# Patient Record
Sex: Female | Born: 1950 | Race: Black or African American | Hispanic: No | Marital: Married | Smoking: Never smoker
Health system: Southern US, Community
[De-identification: ages and names within clinical notes are randomized; demographics above are authoritative.]

## PROBLEM LIST (undated history)

## (undated) DIAGNOSIS — D509 Iron deficiency anemia, unspecified: Secondary | ICD-10-CM

## (undated) DIAGNOSIS — D649 Anemia, unspecified: Secondary | ICD-10-CM

## (undated) DIAGNOSIS — J309 Allergic rhinitis, unspecified: Secondary | ICD-10-CM

## (undated) DIAGNOSIS — Z973 Presence of spectacles and contact lenses: Secondary | ICD-10-CM

## (undated) DIAGNOSIS — N950 Postmenopausal bleeding: Secondary | ICD-10-CM

## (undated) DIAGNOSIS — I10 Essential (primary) hypertension: Secondary | ICD-10-CM

## (undated) DIAGNOSIS — M51369 Other intervertebral disc degeneration, lumbar region without mention of lumbar back pain or lower extremity pain: Secondary | ICD-10-CM

## (undated) DIAGNOSIS — M1990 Unspecified osteoarthritis, unspecified site: Secondary | ICD-10-CM

## (undated) DIAGNOSIS — K219 Gastro-esophageal reflux disease without esophagitis: Secondary | ICD-10-CM

## (undated) DIAGNOSIS — M5432 Sciatica, left side: Secondary | ICD-10-CM

## (undated) DIAGNOSIS — R9389 Abnormal findings on diagnostic imaging of other specified body structures: Secondary | ICD-10-CM

## (undated) DIAGNOSIS — M5136 Other intervertebral disc degeneration, lumbar region: Secondary | ICD-10-CM

## (undated) HISTORY — PX: TUBAL LIGATION: SHX77

---

## 1999-04-28 ENCOUNTER — Encounter: Payer: Self-pay | Admitting: Emergency Medicine

## 1999-04-28 ENCOUNTER — Emergency Department (HOSPITAL_COMMUNITY): Admission: EM | Admit: 1999-04-28 | Discharge: 1999-04-28 | Payer: Self-pay | Admitting: Emergency Medicine

## 2002-08-04 ENCOUNTER — Emergency Department (HOSPITAL_COMMUNITY): Admission: EM | Admit: 2002-08-04 | Discharge: 2002-08-05 | Payer: Self-pay | Admitting: Emergency Medicine

## 2002-08-04 ENCOUNTER — Encounter: Payer: Self-pay | Admitting: Emergency Medicine

## 2003-10-16 ENCOUNTER — Encounter: Admission: RE | Admit: 2003-10-16 | Discharge: 2003-10-16 | Payer: Self-pay | Admitting: Family Medicine

## 2003-10-21 ENCOUNTER — Ambulatory Visit (HOSPITAL_COMMUNITY): Admission: RE | Admit: 2003-10-21 | Discharge: 2003-10-21 | Payer: Self-pay | Admitting: Family Medicine

## 2006-01-23 ENCOUNTER — Emergency Department (HOSPITAL_COMMUNITY): Admission: EM | Admit: 2006-01-23 | Discharge: 2006-01-23 | Payer: Self-pay | Admitting: Family Medicine

## 2006-04-24 ENCOUNTER — Emergency Department (HOSPITAL_COMMUNITY): Admission: EM | Admit: 2006-04-24 | Discharge: 2006-04-24 | Payer: Self-pay | Admitting: Emergency Medicine

## 2012-03-06 ENCOUNTER — Ambulatory Visit (INDEPENDENT_AMBULATORY_CARE_PROVIDER_SITE_OTHER): Payer: BC Managed Care – PPO | Admitting: Family Medicine

## 2012-03-06 VITALS — BP 125/82 | HR 61 | Temp 98.6°F | Resp 18 | Ht 64.0 in | Wt 222.0 lb

## 2012-03-06 DIAGNOSIS — Z0000 Encounter for general adult medical examination without abnormal findings: Secondary | ICD-10-CM

## 2012-03-06 DIAGNOSIS — Z23 Encounter for immunization: Secondary | ICD-10-CM

## 2012-03-06 LAB — LIPID PANEL
LDL Cholesterol: 107 mg/dL — ABNORMAL HIGH (ref 0–99)
VLDL: 33 mg/dL (ref 0–40)

## 2012-03-06 LAB — POCT CBC
Granulocyte percent: 50.8 %G (ref 37–80)
HCT, POC: 37.5 % — AB (ref 37.7–47.9)
POC Granulocyte: 3.6 (ref 2–6.9)
POC LYMPH PERCENT: 42.8 %L (ref 10–50)
Platelet Count, POC: 322 10*3/uL (ref 142–424)
RBC: 4.31 M/uL (ref 4.04–5.48)
RDW, POC: 15.2 %

## 2012-03-06 LAB — POCT UA - MICROSCOPIC ONLY
Casts, Ur, LPF, POC: NEGATIVE
Crystals, Ur, HPF, POC: NEGATIVE
Mucus, UA: POSITIVE
Yeast, UA: NEGATIVE

## 2012-03-06 LAB — COMPREHENSIVE METABOLIC PANEL
ALT: 9 U/L (ref 0–35)
Alkaline Phosphatase: 133 U/L — ABNORMAL HIGH (ref 39–117)
CO2: 29 mEq/L (ref 19–32)
Sodium: 139 mEq/L (ref 135–145)
Total Bilirubin: 0.4 mg/dL (ref 0.3–1.2)
Total Protein: 8.3 g/dL (ref 6.0–8.3)

## 2012-03-06 LAB — POCT URINALYSIS DIPSTICK
Nitrite, UA: NEGATIVE
Protein, UA: 30
pH, UA: 6.5

## 2012-03-06 LAB — POCT GLYCOSYLATED HEMOGLOBIN (HGB A1C): Hemoglobin A1C: 5.9

## 2012-03-06 NOTE — Progress Notes (Signed)
Patient Name: Tiffany Scott [facility] Date of Birth: 05/27/1951 Medical Record Number: [id number] Gender: female Date of Encounter: 03/06/2012  History of Present Illness:  Tiffany Scott is a 61 y.o. very pleasant female patient who presents with the following:  She would like to have a physical and pap today.  She has not had a PE in some time.  She also desires BW today.  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   She is married, but has no children.  She is usually very active.   Strong family history of DM and HTN.  She notes occasional dizziness- ?vertigo.  She had an attack of what sounds like vertigo abut a month ago.  It lasted for a few minutes and resolved completely. She had a BTL many years ago, and has been menopausal since her 71s.   Never had a colonoscopy, last pap and mammogram some time ago.  Unsure last tetanus shot   There is no problem list on file for this patient.  No past medical history on file. No past surgical history on file. History  Substance Use Topics  . Smoking status: Never Smoker   . Smokeless tobacco: Not on file  . Alcohol Use: Not on file   No family history on file. No Known Allergies  Medication list has been reviewed and updated.  Prior to Admission medications   Not on File    Review of Systems:  As per HPI- otherwise negative.   Physical Examination: Filed Vitals:   03/06/12 0952  BP: 125/82  Pulse: 61  Temp: 98.6 F (37 C)  Resp: 18   Filed Vitals:   03/06/12 0952  Height: 5\' 4"  (1.626 m)  Weight: 222 lb (100.699 kg)   Body mass index is 38.11 kg/(m^2). Ideal Body Weight: Weight in (lb) to have BMI = 25: 145.3   GEN: WDWN, NAD, Non-toxic, A & O x 3, obese HEENT: Atraumatic, Normocephalic. Neck supple. No masses, No LAD.  TM and oropharynx wnl, PEERl, EOMI Ears and Nose: No external deformity. CV: RRR, No M/G/R. No JVD. No thrill. No extra heart sounds. PULM: CTA B, no wheezes, crackles, rhonchi. No retractions.  No resp. distress. No accessory muscle use. ABD: S, NT, ND, +BS. No rebound. No HSM. EXTR: No c/c/e NEURO Normal gait.  PSYCH: Normally interactive. Conversant. Not depressed or anxious appearing.  Calm demeanor.  Breast exam: wnl, no masses, dimpling or skin changes GU exam: normal internal and external exam  Results for orders placed in visit on 03/06/12  POCT UA - MICROSCOPIC ONLY      Component Value Range   WBC, Ur, HPF, POC 5-8     RBC, urine, microscopic 1-4     Bacteria, U Microscopic trace     Mucus, UA positive     Epithelial cells, urine per micros 10-12     Crystals, Ur, HPF, POC neg     Casts, Ur, LPF, POC neg     Yeast, UA neg    POCT URINALYSIS DIPSTICK      Component Value Range   Color, UA amber     Clarity, UA clear     Glucose, UA neg     Bilirubin, UA small     Ketones, UA trace     Spec Grav, UA 1.020     Blood, UA trace-lysed     pH, UA 6.5     Protein, UA 30     Urobilinogen, UA 0.2  Nitrite, UA neg     Leukocytes, UA Trace    POCT CBC      Component Value Range   WBC 7.0  4.6 - 10.2 K/uL   Lymph, poc 3.0  0.6 - 3.4   POC LYMPH PERCENT 42.8  10 - 50 %L   MID (cbc) 0.4  0 - 0.9   POC MID % 6.4  0 - 12 %M   POC Granulocyte 3.6  2 - 6.9   Granulocyte percent 50.8  37 - 80 %G   RBC 4.31  4.04 - 5.48 M/uL   Hemoglobin 11.4 (*) 12.2 - 16.2 g/dL   HCT, POC 19.1 (*) 47.8 - 47.9 %   MCV 87.0  80 - 97 fL   MCH, POC 26.5 (*) 27 - 31.2 pg   MCHC 30.4 (*) 31.8 - 35.4 g/dL   RDW, POC 29.5     Platelet Count, POC 322  142 - 424 K/uL   MPV 8.8  0 - 99.8 fL  POCT GLYCOSYLATED HEMOGLOBIN (HGB A1C)      Component Value Range   Hemoglobin A1C 5.9     Assessment and Plan: 1. Annual physical exam  POCT UA - Microscopic Only, POCT urinalysis dipstick, POCT CBC, POCT glycosylated hemoglobin (Hb A1C), Comprehensive metabolic panel, Lipid panel, Pap IG, CT/NG w/ reflex HPV when ASC-U, TSH, Tdap vaccine greater than or equal to 7yo IM   Follow-up  microhematuria with other labs- suspect dirty catch and will have her repeat later.  Will plan further follow- up pending labs. See pt instructions for more.  tdap today.  Gave rx for Tiffany Stare, MD

## 2012-03-06 NOTE — Patient Instructions (Addendum)
Don't forget to schedule your colonoscopy- this can be done at any GI doctor's office [facility] division  I will be in touch with you regarding the rest of your labs.   Consider getting the shingles vaccine- I will give you a prescription for this.    Mammograms are available at several locations in [facility]

## 2012-03-08 LAB — PAP IG, CT-NG, RFX HPV ASCU

## 2012-03-09 ENCOUNTER — Encounter: Payer: Self-pay | Admitting: Family Medicine

## 2012-03-09 NOTE — Addendum Note (Signed)
Addended by: Abbe Amsterdam C on: 03/09/2012 04:56 PM   Modules accepted: Orders

## 2012-03-10 ENCOUNTER — Telehealth: Payer: Self-pay | Admitting: Family Medicine

## 2012-03-10 NOTE — Telephone Encounter (Signed)
Sent copy of letter

## 2013-03-16 ENCOUNTER — Encounter: Payer: Self-pay | Admitting: Family Medicine

## 2013-04-04 ENCOUNTER — Encounter (HOSPITAL_COMMUNITY): Payer: Self-pay | Admitting: Family Medicine

## 2013-04-04 ENCOUNTER — Emergency Department (HOSPITAL_COMMUNITY)
Admission: EM | Admit: 2013-04-04 | Discharge: 2013-04-04 | Disposition: A | Discharge status: Home / Self Care | Payer: BC Managed Care – PPO | Attending: Emergency Medicine | Admitting: Emergency Medicine

## 2013-04-04 DIAGNOSIS — R51 Headache: Secondary | ICD-10-CM | POA: Insufficient documentation

## 2013-04-04 DIAGNOSIS — I10 Essential (primary) hypertension: Secondary | ICD-10-CM | POA: Insufficient documentation

## 2013-04-04 LAB — POCT I-STAT, CHEM 8
BUN: 12 mg/dL (ref 6–23)
Calcium, Ion: 1.24 mmol/L (ref 1.13–1.30)
Chloride: 103 mEq/L (ref 96–112)
Potassium: 3.6 mEq/L (ref 3.5–5.1)

## 2013-04-04 MED ORDER — HYDROCHLOROTHIAZIDE 12.5 MG PO TABS: 12.5000 mg | ORAL_TABLET | Freq: Every day | ORAL | Status: DC

## 2013-04-04 NOTE — ED Notes (Signed)
Per pt she has never had a problem with her BP before until recently. sts she has been having a HA and has been checking her BP and very elevated. sts she feels okay otherwise and has been working. Pt very hypertensive at triage. 201/110

## 2013-04-04 NOTE — ED Notes (Signed)
Vitals taken at 1540 that is times at 1610.  Epic would not allow time change

## 2013-04-04 NOTE — ED Notes (Signed)
Patient reports that she has had some dizzy spells lately that is new

## 2013-04-04 NOTE — ED Notes (Signed)
Discharge instructions reviewed. Pt verbalized understanding.  

## 2013-04-04 NOTE — ED Provider Notes (Signed)
CSN: [id number]     Arrival date & time 04/04/13  1317 History     First MD Initiated Contact with Patient 04/04/13 1604     Chief Complaint  Patient presents with  . Hypertension   (Consider location/radiation/quality/duration/timing/severity/associated sxs/prior Treatment) HPI  62 year old female with no significant past medical history presents for evaluations of headache and elevated blood pressure. Patient reports she would have occasional headache. Headache is to her for head, throbbing, lasting only for seconds to minutes. This has been ongoing for over a year. Nothing seems to make symptoms better or worse. Taking Tylenol or ibuprofen seems to help. She has been checking her blood pressure using her husband's blood pressure machine at home for the past year and noticed that her blood pressure has been running in the 150s systolic. Today she was having a headache, and when she checked her blood pressure was 190 systolic. Patient decided to come to ER for further evaluation. Patient otherwise states she is generally healthy, does not take any medication. Has not follow up with her doctor in many years. She is a nonsmoker. She denies fever, chills, vision changes, chest pain, shortness of breath, nausea, vomiting, diarrhea, numbness, weakness, or rash. She admits to taking her present blood pressure medication on occasion when her headache although blood pressure is high.  History reviewed. No pertinent past medical history. History reviewed. No pertinent past surgical history. History reviewed. No pertinent family history. History  Substance Use Topics  . Smoking status: Never Smoker   . Smokeless tobacco: Not on file  . Alcohol Use: No   OB History   Grav Para Term Preterm Abortions TAB SAB Ect Mult Living                 Review of Systems  All other systems reviewed and are negative.    Allergies  Review of patient's allergies indicates no known allergies.  Home  Medications   Current Outpatient Rx  Name  Route  Sig  Dispense  Refill  . acetaminophen (TYLENOL) 325 MG tablet   Oral   Take 650 mg by mouth every 6 (six) hours as needed for pain.         Marland Kitchen ibuprofen (ADVIL,MOTRIN) 200 MG tablet   Oral   Take 400 mg by mouth every 6 (six) hours as needed for pain.         . Multiple Vitamins-Minerals (MULTIVITAMIN WITH MINERALS) tablet   Oral   Take 1 tablet by mouth daily.          BP 201/110  Pulse 85  Temp(Src) 98.3 F (36.8 C)  Resp 18  SpO2 95% Physical Exam  Nursing note and vitals reviewed. Constitutional: She is oriented to person, place, and time. She appears well-developed and well-nourished. No distress.  Awake, alert, nontoxic appearance  HENT:  Head: Atraumatic.  Eyes: Conjunctivae and EOM are normal. Pupils are equal, round, and reactive to light. Right eye exhibits no discharge. Left eye exhibits no discharge.  Neck: Normal range of motion. Neck supple.  No nuchal rigidity  Cardiovascular: Normal rate, regular rhythm and intact distal pulses.   Pulmonary/Chest: Effort normal. No respiratory distress. She exhibits no tenderness.  Abdominal: Soft. There is no tenderness. There is no rebound.  Musculoskeletal: She exhibits no edema and no tenderness.  ROM appears intact, no obvious focal weakness  Neurological: She is alert and oriented to person, place, and time. She has normal strength. No cranial nerve deficit or sensory deficit.  She displays a negative Romberg sign. Coordination and gait normal. GCS eye subscore is 4. GCS verbal subscore is 5. GCS motor subscore is 6.  Mental status and motor strength appears intact  Skin: No rash noted.  Psychiatric: She has a normal mood and affect.    ED Course   Procedures (including critical care time)   Date: 04/04/2013  Rate: 64  Rhythm: normal sinus rhythm  QRS Axis: normal  Intervals: normal  ST/T Wave abnormalities: normal  Conduction Disutrbances:none   Narrative Interpretation: LVH  Old EKG Reviewed: none available    4:25 PM Pt report headache and high BP.  No red flags.  PB in triage was 201/110, however repeated BP in the room has been 150s systolic.  I do not suspect stroke, hypertensive urgency or emergency.  Do not suspect end organ damage. ECG without evidence of acute ischemic changes.  Discussed with attending.  Do not think head CT warranted at this time, considering sxs ongoing >1year. Plan to prescribe diuretic pill and have to f/u closely with her PCP for further care.    Labs Reviewed - No data to display No results found. 1. Hypertension     MDM  BP 168/85  Pulse 84  Temp(Src) 98.3 F (36.8 C)  Resp 20  SpO2 98%   Fayrene Helper, PA-C 04/04/13 1640  Fayrene Helper, PA-C 04/04/13 1647

## 2013-04-04 NOTE — ED Provider Notes (Signed)
Medical screening examination/treatment/procedure(s) were performed by non-physician practitioner and as supervising physician I was immediately available for consultation/collaboration.   Lyanne Co, MD 04/04/13 [phone]

## 2013-09-12 HISTORY — PX: COLONOSCOPY: SHX174

## 2013-09-30 ENCOUNTER — Ambulatory Visit (INDEPENDENT_AMBULATORY_CARE_PROVIDER_SITE_OTHER): Payer: BC Managed Care – PPO | Admitting: Physician Assistant

## 2013-09-30 VITALS — BP 130/80 | HR 70 | Temp 97.8°F | Resp 16 | Ht 64.5 in | Wt 230.0 lb

## 2013-09-30 DIAGNOSIS — IMO0002 Reserved for concepts with insufficient information to code with codable children: Secondary | ICD-10-CM

## 2013-09-30 DIAGNOSIS — M79609 Pain in unspecified limb: Secondary | ICD-10-CM

## 2013-09-30 DIAGNOSIS — L02411 Cutaneous abscess of right axilla: Secondary | ICD-10-CM

## 2013-09-30 DIAGNOSIS — M79621 Pain in right upper arm: Secondary | ICD-10-CM

## 2013-09-30 MED ORDER — DOXYCYCLINE HYCLATE 100 MG PO TABS: 100.0000 mg | ORAL_TABLET | Freq: Two times a day (BID) | ORAL | Status: DC

## 2013-09-30 NOTE — Patient Instructions (Signed)
Warm compresses at least 2x/day - change the dressing daily - recheck in 48h if area is nor improved or sooner if worse - pull out the packing if it has not fallen out in 2 days.

## 2013-09-30 NOTE — Progress Notes (Signed)
   Subjective:    Patient ID: Tiffany Scott, female    DOB: September 29, 1950, 63 y.o.   MRN: [id number]  HPI  Patient presents to clinic with painful, tender nodules in the right axilla beginning 09/23/13. Began with two nodules and have multiplied over last week. Reports upper right arm pain and soreness wrapping around to the back of the shoulder. Feels like axilla appears swollen. Denies locazlied itching. Has been bathing with peroxide twice a day. Over the past couple of years, has had episodes of one or two small nodules that would get sore and then go away on their own.  No new deodorants, lotions or soaps.   Review of Systems  Constitutional: Negative for fever and chills.  Musculoskeletal: Negative for myalgias.       Objective:   Physical Exam  Vitals reviewed. Constitutional: She is oriented to person, place, and time. She appears well-developed and well-nourished.  Cardiovascular: Normal rate, regular rhythm and normal heart sounds.   Pulmonary/Chest: Effort normal and breath sounds normal.  Neurological: She is alert and oriented to person, place, and time.  Skin: Skin is warm and dry.  7-8 nodules approximately 1-2 cm in size located in the right axilla. The 2 lesions most distal have surrounding induration with central fluctuance. Lateral border of axilla nearest these lesions erythematous and slightly indurated.   Psychiatric: She has a normal mood and affect. Her behavior is normal. Judgment and thought content normal.   Procedure: VCO - Alcohol pads used for cleaning. 2% lidocaine with epi used for local anesthesia on the 2 most distal nodules. #11 blade used to make 1 cm incision in medial nodule. Lateral nodule 2 incisions made for best drainage of purulent discharge. Purulent drainage expressed from all incisions.Wounds explored - no loculations found. 1/4 inch plain packing placed in most medial wound. Dressing placed.       Assessment & Plan:   1. Abscess of axilla,  right Remove packing in 48 hours. Apply warm compresses at least twice daily and change the dressing daily. Take doxycyline twice daily with food but do not drink milk 2 hr before or 2 hr after taking medication.  - Wound culture - doxycycline (VIBRA-TABS) 100 MG tablet; Take 1 tablet (100 mg total) by mouth 2 (two) times daily.  Dispense: 20 tablet; Refill: 0  2. Pain in right axilla

## 2013-09-30 NOTE — Progress Notes (Signed)
I was directly involved with the patient's care and agree with the physical, diagnosis and treatment plan and I actively participated during the procedure.

## 2013-10-03 LAB — WOUND CULTURE
Gram Stain: NONE SEEN
Gram Stain: NONE SEEN

## 2014-05-09 ENCOUNTER — Ambulatory Visit (INDEPENDENT_AMBULATORY_CARE_PROVIDER_SITE_OTHER): Payer: 59 | Admitting: Family Medicine

## 2014-05-09 VITALS — BP 158/86 | HR 68 | Temp 97.8°F | Resp 18 | Ht 64.0 in | Wt 233.8 lb

## 2014-05-09 DIAGNOSIS — J029 Acute pharyngitis, unspecified: Secondary | ICD-10-CM

## 2014-05-09 DIAGNOSIS — J3089 Other allergic rhinitis: Secondary | ICD-10-CM

## 2014-05-09 LAB — POCT RAPID STREP A (OFFICE): RAPID STREP A SCREEN: NEGATIVE

## 2014-05-09 MED ORDER — LORATADINE 10 MG PO CAPS: 1.0000 | ORAL_CAPSULE | Freq: Every day | ORAL | Status: DC

## 2014-05-09 MED ORDER — FLUTICASONE PROPIONATE 50 MCG/ACT NA SUSP: 2.0000 | Freq: Every day | NASAL | Status: DC

## 2014-05-09 NOTE — Progress Notes (Signed)
Subjective:    Patient ID: Tiffany Scott, female    DOB: 25-Apr-1951, 63 y.o.   MRN: [id number]  05/09/2014  Sore Throat and Otalgia   Sore Throat  Associated symptoms include coughing, ear pain and trouble swallowing. Pertinent negatives include no congestion, diarrhea, headaches, shortness of breath, stridor or vomiting.  Otalgia  Associated symptoms include coughing and a sore throat. Pertinent negatives include no diarrhea, headaches, rash, rhinorrhea or vomiting.   This 63 y.o. female presents for evaluation of sore throat, PND. Onset two weeks ago. No fever/chills/sweats. No malaise or fatigue.  Intermittent HA.  L ear pain recurrent tand intermittent. No nasal congestion or rhinorrhea.  +ST mostly L sided.  Pain with swallowing.  +PND chronic.  +Dry hacking cough. No SOB.  +sputum production.  No v/d.  Taking Tylenol and Motrin prn.  Recurrent issue.  No sneezing; no itching eyes.   Review of Systems  Constitutional: Negative for fever, chills, diaphoresis, fatigue and unexpected weight change.  HENT: Positive for ear pain, postnasal drip, sore throat, trouble swallowing and voice change. Negative for congestion, rhinorrhea, sinus pressure and sneezing.   Respiratory: Positive for cough. Negative for shortness of breath and stridor.   Gastrointestinal: Negative for nausea, vomiting and diarrhea.  Skin: Negative for rash.  Neurological: Negative for headaches.    History reviewed. No pertinent past medical history. History reviewed. No pertinent past surgical history. No Known Allergies Current Outpatient Prescriptions  Medication Sig Dispense Refill  . acetaminophen (TYLENOL) 325 MG tablet Take 650 mg by mouth every 6 (six) hours as needed for pain.      Marland Kitchen doxycycline (VIBRA-TABS) 100 MG tablet Take 1 tablet (100 mg total) by mouth 2 (two) times daily.  20 tablet  0  . fluticasone (FLONASE) 50 MCG/ACT nasal spray Place 2 sprays into both nostrils daily.  16 g  11  . ibuprofen  (ADVIL,MOTRIN) 200 MG tablet Take 400 mg by mouth every 6 (six) hours as needed for pain.      . Loratadine 10 MG CAPS Take 1 capsule (10 mg total) by mouth daily.  30 each  11  . Multiple Vitamins-Minerals (MULTIVITAMIN WITH MINERALS) tablet Take 1 tablet by mouth daily.       No current facility-administered medications for this visit.   History   Social History  . Marital Status: Married    Spouse Name: N/A    Number of Children: N/A  . Years of Education: N/A   Occupational History  . Not on file.   Social History Main Topics  . Smoking status: Never Smoker   . Smokeless tobacco: Not on file  . Alcohol Use: No  . Drug Use: No  . Sexual Activity: Not on file   Other Topics Concern  . Not on file   Social History Narrative  . No narrative on file        Objective:    BP 158/86  Pulse 68  Temp(Src) 97.8 F (36.6 C) (Oral)  Resp 18  Ht  (1.626 m)  Wt 233 lb 12.8 oz (106.051 kg)  BMI 40.11 kg/m2  SpO2 98% Physical Exam  Nursing note and vitals reviewed. Constitutional: She is oriented to person, place, and time. She appears well-developed and well-nourished. No distress.  HENT:  Head: Normocephalic and atraumatic.  Right Ear: External ear normal.  Left Ear: External ear normal.  Nose: Nose normal.  Mouth/Throat: Oropharynx is clear and moist.  Eyes: Conjunctivae are normal. Pupils are  equal, round, and reactive to light.  Neck: Normal range of motion. Neck supple.  Cardiovascular: Normal rate, regular rhythm and normal heart sounds.  Exam reveals no gallop and no friction rub.   No murmur heard. Pulmonary/Chest: Effort normal and breath sounds normal. She has no wheezes. She has no rales.  Lymphadenopathy:    She has cervical adenopathy.  Neurological: She is alert and oriented to person, place, and time.  Skin: No rash noted. She is not diaphoretic.  Psychiatric: She has a normal mood and affect. Her behavior is normal.   Results for orders placed  in visit on 05/09/14  POCT RAPID STREP A (OFFICE)      Result Value Ref Range   Rapid Strep A Screen Negative  Negative       Assessment & Plan:   1. Acute pharyngitis, unspecified pharyngitis type   2. Other allergic rhinitis    1. Acute pharyngitis:  New. Send throat culture; supportive care with rest, fluids, ibuprofen and tylenol. 2.  Allergic Rhinitis:  Uncontrolled; rx for Loratadine and Flonase provided.  Feel PND etiology to sore throat and L ear pain.  Meds ordered this encounter  Medications  . Loratadine 10 MG CAPS    Sig: Take 1 capsule (10 mg total) by mouth daily.    Dispense:  30 each    Refill:  11  . fluticasone (FLONASE) 50 MCG/ACT nasal spray    Sig: Place 2 sprays into both nostrils daily.    Dispense:  16 g    Refill:  11    No Follow-up on file.    Nilda Simmer, M.D.  [facility] [address] [place], [place]  [address] [phone] phone [phone] fax

## 2014-05-09 NOTE — Patient Instructions (Signed)

## 2014-05-11 LAB — CULTURE, GROUP A STREP: ORGANISM ID, BACTERIA: NORMAL

## 2014-05-14 ENCOUNTER — Telehealth: Payer: Self-pay

## 2014-05-14 MED ORDER — AMOXICILLIN 500 MG PO CAPS
1000.0000 mg | ORAL_CAPSULE | Freq: Two times a day (BID) | ORAL | Status: DC
Start: 2014-05-14 — End: 2017-07-10

## 2014-05-14 NOTE — Telephone Encounter (Signed)
Throat culture was negative. Pt complains of continued sore throat and non productive cough. It has been more constant the past few days than it was. Pt has been treating symptoms but it is not going away. Please advise.

## 2014-05-14 NOTE — Telephone Encounter (Signed)
Recommend patient continuing Loratadine/Claritin and Flonase daily. I will send in an antibiotic to treat for sinus infection due to worsening symptoms.  She must return to office if she is unable to swallow due to throat pain.

## 2014-05-14 NOTE — Telephone Encounter (Signed)
[name] - Pt says she is not doing any better.  She still has a dry cough and her throat is still hurting bad.  She wants to know if her lab results are back and if you think she needs she needs an antibiotic.  She says she really thought a lot of you and would come back in if you think its necessary.  [phone]

## 2014-05-15 NOTE — Telephone Encounter (Signed)
Pt walked in this morning and I gave her the information.  She is going to pick up her antibiotic.

## 2014-05-24 ENCOUNTER — Ambulatory Visit (INDEPENDENT_AMBULATORY_CARE_PROVIDER_SITE_OTHER): Payer: 59 | Admitting: Family Medicine

## 2014-05-24 VITALS — BP 120/90 | HR 71 | Temp 98.5°F | Resp 16 | Ht 63.5 in | Wt 234.2 lb

## 2014-05-24 DIAGNOSIS — J320 Chronic maxillary sinusitis: Secondary | ICD-10-CM

## 2014-05-24 DIAGNOSIS — J209 Acute bronchitis, unspecified: Secondary | ICD-10-CM

## 2014-05-24 MED ORDER — PREDNISONE 20 MG PO TABS
40.0000 mg | ORAL_TABLET | Freq: Every day | ORAL | Status: DC
Start: 2014-05-24 — End: 2017-07-10

## 2014-05-24 MED ORDER — LEVOFLOXACIN 500 MG PO TABS: 500.0000 mg | ORAL_TABLET | Freq: Every day | ORAL | Status: DC

## 2014-05-24 NOTE — Progress Notes (Signed)
Subjective:  This chart was scribed for [name], MD by Elveria Rising, Medial Scribe. This patient was seen in room 14 and the patient's care was started at 12:32 PM.    Patient ID: Tiffany Scott, female    DOB: 1951-06-23, 63 y.o.   MRN: [id number]  HPI HPI Comments: Tiffany Scott is a 63 y.o. female who presents to the [facility] complaining of persistent cough for month. Patient was seen by Dr. Katrinka Blazing one month ago for chronic post nasal drainage, sore throat, and dry hacking cough. Patient has been taking her prescribed antibiotics and medications as directed but she denies relief. She reports worsening cough, wheezing, and dry/scratchy throat, and bitter taste in her mouth. She denies abdominal issues and nausea.  Patient denies history of of asthma or tobacco use.  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   There are no active problems to display for this patient.  History reviewed. No pertinent past medical history. History reviewed. No pertinent past surgical history. No Known Allergies Prior to Admission medications   Medication Sig Start Date End Date Taking? Authorizing Provider  acetaminophen (TYLENOL) 325 MG tablet Take 650 mg by mouth every 6 (six) hours as needed for pain.   Yes Historical Provider, MD  amoxicillin (AMOXIL) 500 MG capsule Take 2 capsules (1,000 mg total) by mouth 2 (two) times daily. 05/14/14  Yes Ethelda Chick, MD  fluticasone (FLONASE) 50 MCG/ACT nasal spray Place 2 sprays into both nostrils daily. 05/09/14  Yes Ethelda Chick, MD  ibuprofen (ADVIL,MOTRIN) 200 MG tablet Take 400 mg by mouth every 6 (six) hours as needed for pain.   Yes Historical Provider, MD  Loratadine 10 MG CAPS Take 1 capsule (10 mg total) by mouth daily. 05/09/14  Yes Ethelda Chick, MD  Multiple Vitamins-Minerals (MULTIVITAMIN WITH MINERALS) tablet Take 1 tablet by mouth daily.   Yes Historical Provider, MD   History   Social  History   Marital Status: Married    Spouse Name: Tiffany Scott    Number of Children: Tiffany Scott   Years of Education: Tiffany Scott   Occupational History   Not on file.   Social History Main Topics   Smoking status: Never Smoker    Smokeless tobacco: Never Used   Alcohol Use: No   Drug Use: No   Sexual Activity: Not on file   Other Topics Concern   Not on file   Social History Narrative   No narrative on file    Review of Systems  Constitutional: Negative for fever and chills.  HENT: Positive for postnasal drip. Negative for rhinorrhea and sore throat.   Respiratory: Positive for cough and wheezing.   Gastrointestinal: Negative for nausea, vomiting and abdominal pain.       Objective:   Physical Exam  Nursing note and vitals reviewed. Constitutional: She appears well-developed and well-nourished. No distress.  HENT:  Head: Atraumatic.  Right Ear: Tympanic membrane and external ear normal.  Left Ear: Tympanic membrane and external ear normal.  Clear post nasal drainage. Otherwise normal.  Cardiovascular: Normal rate, regular rhythm and normal heart sounds.   No murmur heard. Pulmonary/Chest: Effort normal. No respiratory distress. She has wheezes.  Expiratory wheezes  Neurological: She is alert.  Skin: She is not diaphoretic.    Filed Vitals:   05/24/14 1212  BP: 120/90  Pulse: 71  Temp: 98.5 F (36.9 C)  TempSrc: Oral  Resp: 16  Height: 5' 3.5" (1.613 m)  Weight: 234 lb 4 oz (106.255 kg)  SpO2: 99%       Assessment & Plan:   1. Maxillary sinusitis, unspecified chronicity   2. Acute bronchitis, unspecified organism    Maxillary sinusitis, unspecified chronicity - Plan: predniSONE (DELTASONE) 20 MG tablet, levofloxacin (LEVAQUIN) 500 MG tablet  Acute bronchitis, unspecified organism - Plan: predniSONE (DELTASONE) 20 MG tablet, levofloxacin (LEVAQUIN) 500 MG tablet Persistent wheezing and sinus congestion suggest an allergic component to this problem. Also has  some element of bacterial infection this ongoing    Signed, [name], MD    I personally performed the services described in this documentation, which was scribed in my presence. The recorded information has been reviewed and is accurate.

## 2014-07-30 ENCOUNTER — Ambulatory Visit (INDEPENDENT_AMBULATORY_CARE_PROVIDER_SITE_OTHER): Payer: 59 | Admitting: Emergency Medicine

## 2014-07-30 ENCOUNTER — Ambulatory Visit (INDEPENDENT_AMBULATORY_CARE_PROVIDER_SITE_OTHER): Payer: 59

## 2014-07-30 VITALS — BP 128/80 | HR 68 | Temp 98.4°F | Resp 16 | Ht 63.5 in | Wt 236.0 lb

## 2014-07-30 DIAGNOSIS — R05 Cough: Secondary | ICD-10-CM

## 2014-07-30 DIAGNOSIS — R059 Cough, unspecified: Secondary | ICD-10-CM

## 2014-07-30 DIAGNOSIS — J209 Acute bronchitis, unspecified: Secondary | ICD-10-CM

## 2014-07-30 MED ORDER — LEVOFLOXACIN 500 MG PO TABS: 500.0000 mg | ORAL_TABLET | Freq: Every day | ORAL | Status: AC

## 2014-07-30 MED ORDER — PROMETHAZINE-CODEINE 6.25-10 MG/5ML PO SYRP: 5.0000 mL | ORAL_SOLUTION | ORAL | Status: DC | PRN

## 2014-07-30 NOTE — Progress Notes (Signed)
[facility] [address], [place] [id number] [phone]  Date:  07/30/2014   Name:  Tiffany Scott   DOB:  Mar 17, 1951   MRN:  [id number]  PCP:  Astrid DivineGRIFFIN,ELAINE COLLINS, MD    Chief Complaint: Cough   History of Present Illness:  Tiffany Noelva R Hakimi is a 63 y.o. very pleasant female patient who presents with the following:  Ill with a sinus infection treated 4 months ago with levaquin. Now has recurrent cough over the past four months associated with scant sputum  Has wheezing.  No shortness of breath  No orthopnea or PND No fever or chills.   No nausea or vomiting or stool change.  No rash No improvement with over the counter medications or other home remedies.  Denies other complaint or health concern today.   There are no active problems to display for this patient.   History reviewed. No pertinent past medical history.  History reviewed. No pertinent past surgical history.  History  Substance Use Topics  . Smoking status: Never Smoker   . Smokeless tobacco: Never Used  . Alcohol Use: No    History reviewed. No pertinent family history.  No Known Allergies  Medication list has been reviewed and updated.  Current Outpatient Prescriptions on File Prior to Visit  Medication Sig Dispense Refill  . acetaminophen (TYLENOL) 325 MG tablet Take 650 mg by mouth every 6 (six) hours as needed for pain.    Marland Kitchen. amoxicillin (AMOXIL) 500 MG capsule Take 2 capsules (1,000 mg total) by mouth 2 (two) times daily. 40 capsule 0  . fluticasone (FLONASE) 50 MCG/ACT nasal spray Place 2 sprays into both nostrils daily. 16 g 11  . ibuprofen (ADVIL,MOTRIN) 200 MG tablet Take 400 mg by mouth every 6 (six) hours as needed for pain.    Marland Kitchen. levofloxacin (LEVAQUIN) 500 MG tablet Take 1 tablet (500 mg total) by mouth daily. 7 tablet 0  . Loratadine 10 MG CAPS Take 1 capsule (10 mg total) by mouth daily. 30 each 11  . Multiple Vitamins-Minerals (MULTIVITAMIN WITH MINERALS) tablet  Take 1 tablet by mouth daily.    . predniSONE (DELTASONE) 20 MG tablet Take 2 tablets (40 mg total) by mouth daily. 10 tablet 0   No current facility-administered medications on file prior to visit.    Review of Systems:  As per HPI, otherwise negative.    Physical Examination: Filed Vitals:   07/30/14 1138  BP: 128/80  Pulse: 68  Temp: 98.4 F (36.9 C)  Resp: 16   Filed Vitals:   07/30/14 1138  Height: 5' 3.5" (1.613 m)  Weight: 236 lb (107.049 kg)   Body mass index is 41.14 kg/(m^2). Ideal Body Weight: Weight in (lb) to have BMI = 25: 143.1  GEN: WDWN, NAD, Non-toxic, A & O x 3 HEENT: Atraumatic, Normocephalic. Neck supple. No masses, No LAD. Ears and Nose: No external deformity. CV: RRR, No M/G/R. No JVD. No thrill. No extra heart sounds. PULM: CTA B, no wheezes, crackles, rhonchi. No retractions. No resp. distress. No accessory muscle use. ABD: S, NT, ND, +BS. No rebound. No HSM. EXTR: No c/c/e NEURO Normal gait.  PSYCH: Normally interactive. Conversant. Not depressed or anxious appearing.  Calm demeanor.    Assessment and Plan: Sinusitis Bronchitis levaquin Phen c cod nasacort   Signed,  Phillips OdorJeffery Jaheem Hedgepath, MD   [facility] reading (PRIMARY) by  Dr. Dareen PianoAnderson.  Negative chest .

## 2014-07-30 NOTE — Patient Instructions (Signed)

## 2014-08-19 ENCOUNTER — Other Ambulatory Visit: Payer: Self-pay | Admitting: Emergency Medicine

## 2015-06-29 DIAGNOSIS — I10 Essential (primary) hypertension: Secondary | ICD-10-CM | POA: Diagnosis not present

## 2015-06-29 DIAGNOSIS — Z23 Encounter for immunization: Secondary | ICD-10-CM | POA: Diagnosis not present

## 2015-06-29 DIAGNOSIS — J309 Allergic rhinitis, unspecified: Secondary | ICD-10-CM | POA: Diagnosis not present

## 2015-06-29 DIAGNOSIS — Z1389 Encounter for screening for other disorder: Secondary | ICD-10-CM | POA: Diagnosis not present

## 2015-06-29 DIAGNOSIS — Z0000 Encounter for general adult medical examination without abnormal findings: Secondary | ICD-10-CM | POA: Diagnosis not present

## 2015-07-23 DIAGNOSIS — Z780 Asymptomatic menopausal state: Secondary | ICD-10-CM | POA: Diagnosis not present

## 2015-07-29 DIAGNOSIS — K644 Residual hemorrhoidal skin tags: Secondary | ICD-10-CM | POA: Diagnosis not present

## 2015-07-29 DIAGNOSIS — Z1211 Encounter for screening for malignant neoplasm of colon: Secondary | ICD-10-CM | POA: Diagnosis not present

## 2015-07-29 DIAGNOSIS — K921 Melena: Secondary | ICD-10-CM | POA: Diagnosis not present

## 2015-08-31 DIAGNOSIS — Z1211 Encounter for screening for malignant neoplasm of colon: Secondary | ICD-10-CM | POA: Diagnosis not present

## 2015-08-31 DIAGNOSIS — K5730 Diverticulosis of large intestine without perforation or abscess without bleeding: Secondary | ICD-10-CM | POA: Diagnosis not present

## 2017-07-10 ENCOUNTER — Ambulatory Visit (HOSPITAL_COMMUNITY)
Admission: EM | Admit: 2017-07-10 | Discharge: 2017-07-10 | Disposition: A | Discharge status: Home / Self Care | Payer: Medicare Other | Attending: Emergency Medicine | Admitting: Emergency Medicine

## 2017-07-10 ENCOUNTER — Encounter (HOSPITAL_COMMUNITY): Payer: Self-pay | Admitting: Emergency Medicine

## 2017-07-10 DIAGNOSIS — R0982 Postnasal drip: Secondary | ICD-10-CM

## 2017-07-10 DIAGNOSIS — J029 Acute pharyngitis, unspecified: Secondary | ICD-10-CM

## 2017-07-10 DIAGNOSIS — L03012 Cellulitis of left finger: Secondary | ICD-10-CM | POA: Diagnosis not present

## 2017-07-10 HISTORY — DX: Essential (primary) hypertension: I10

## 2017-07-10 MED ORDER — LIDOCAINE HCL (PF) 1 % IJ SOLN
INTRAMUSCULAR | Status: AC
  Filled 2017-07-10: qty 2

## 2017-07-10 MED ORDER — CEFTRIAXONE SODIUM 1 G IJ SOLR
INTRAMUSCULAR | Status: AC
  Filled 2017-07-10: qty 10

## 2017-07-10 MED ORDER — CEPHALEXIN 500 MG PO CAPS: 500.0000 mg | ORAL_CAPSULE | Freq: Four times a day (QID) | ORAL | 0 refills | Status: DC

## 2017-07-10 MED ORDER — CEFTRIAXONE SODIUM 1 G IJ SOLR
1.0000 g | Freq: Once | INTRAMUSCULAR | Status: AC
  Administered 2017-07-10: 1 g via INTRAMUSCULAR

## 2017-07-10 NOTE — ED Provider Notes (Signed)
[facility]    CSN: [id number] Arrival date & time: 07/10/17  1233     History   Chief Complaint Chief Complaint  Patient presents with  . URI    HPI Tiffany Scott is a 66 y.o. female.   66 year old female presents to the urgent care with 2 complaints. First complaint is that of PND for several weeks. She states is getting worse and causing some mild sore throat. Nighttime it makes her choke when supine. It is associated with a cough. The only medicine she is using is a throat spray. This does not help. She has a history of allergies and tends to have a minimal amount of drainage year-round. Denies fever or chills, earache.  Second complaint is that of pain redness and swelling to the left ring finger that started 3-5 days ago. No known injury.      Past Medical History:  Diagnosis Date  . Hypertension     There are no active problems to display for this patient.   History reviewed. No pertinent surgical history.  OB History    No data available       Home Medications    Prior to Admission medications   Medication Sig Start Date End Date Taking? Authorizing Provider  LOSARTAN POTASSIUM PO Take by mouth.   Yes [provider]  acetaminophen (TYLENOL) 325 MG tablet Take 650 mg by mouth every 6 (six) hours as needed for pain.    [provider]  cephALEXin (KEFLEX) 500 MG capsule Take 1 capsule (500 mg total) by mouth 4 (four) times daily. 07/10/17   Hayden RasmussenMabe, Vaishali Baise, NP  fluticasone (FLONASE) 50 MCG/ACT nasal spray Place 2 sprays into both nostrils daily. 05/09/14   Ethelda ChickSmith, Kristi M, MD  ibuprofen (ADVIL,MOTRIN) 200 MG tablet Take 400 mg by mouth every 6 (six) hours as needed for pain.    [provider]  Loratadine 10 MG CAPS Take 1 capsule (10 mg total) by mouth daily. 05/09/14   Ethelda ChickSmith, Kristi M, MD  Multiple Vitamins-Minerals (MULTIVITAMIN WITH MINERALS) tablet Take 1 tablet by mouth daily.    [provider]    Family  History No family history on file.  Social History Social History  Substance Use Topics  . Smoking status: Never Smoker  . Smokeless tobacco: Never Used  . Alcohol use No     Allergies   Patient has no known allergies.   Review of Systems Review of Systems  Constitutional: Negative.   HENT: Positive for postnasal drip and sore throat. Negative for facial swelling.   Eyes: Negative.   Respiratory: Positive for cough. Negative for shortness of breath.   Cardiovascular: Negative.   Gastrointestinal: Negative.   Musculoskeletal:       As per history of present illness  Neurological: Negative.   All other systems reviewed and are negative.    Physical Exam Triage Vital Signs ED Triage Vitals  Enc Vitals Group     BP 07/10/17 1329 138/81     Pulse Rate 07/10/17 1329 77     Resp 07/10/17 1329 18     Temp 07/10/17 1329 (!) 97.5 F (36.4 C)     Temp Source 07/10/17 1329 Oral     SpO2 07/10/17 1329 100 %     Weight --      Height --      Head Circumference --      Peak Flow --      Pain Score 07/10/17 1326 9  Pain Loc --      Pain Edu? --      Excl. in GC? --    No data found.   Updated Vital Signs BP 138/81 (BP Location: Left Arm)   Pulse 77   Temp (!) 97.5 F (36.4 C) (Oral)   Resp 18   SpO2 100%   Visual Acuity Right Eye Distance:   Left Eye Distance:   Bilateral Distance:    Right Eye Near:   Left Eye Near:    Bilateral Near:     Physical Exam  Constitutional: She is oriented to person, place, and time. She appears well-developed and well-nourished. No distress.  HENT:  Bilateral TMs are normal. Oropharynx with mild PND and light cobblestoning. No exudate. Airway widely patent.  Eyes: EOM are normal.  Neck: Normal range of motion. Neck supple.  Cardiovascular: Normal rate and regular rhythm.   Pulmonary/Chest: Effort normal and breath sounds normal. No respiratory distress. She has no wheezes.  Musculoskeletal: She exhibits tenderness. She  exhibits no deformity.  Left ring finger with erythema and tenderness along the ulnar aspect of the first and second phalanges. No tenderness to the pulp or nail. Able to partially flex but not completely. There is minor swelling approximately but no erythema or tenderness.  Lymphadenopathy:    She has no cervical adenopathy.  Neurological: She is alert and oriented to person, place, and time.  Skin: Skin is warm and dry.  Psychiatric: She has a normal mood and affect.  Nursing note and vitals reviewed.    UC Treatments / Results  Labs (all labs ordered are listed, but only abnormal results are displayed) Labs Reviewed - No data to display  EKG  EKG Interpretation None       Radiology No results found.  Procedures Procedures (including critical care time)  Medications Ordered in UC Medications  cefTRIAXone (ROCEPHIN) injection 1 g (not administered)     Initial Impression / Assessment and Plan / UC Course  I have reviewed the triage vital signs and the nursing notes.  Pertinent labs & imaging results that were available during my care of the patient were reviewed by me and considered in my medical decision making (see chart for details).    You are given an injection of an antibiotic today. Also pick up her prescription antibiotic at the pharmacy today and start taking it. Warm soaks to the finger. He did not need to put any medication on the finger. If it is getting worse in the next 2-3 days either return or call the hand specialist on this pain. They should be calling you for an appointment to see you soon. An infection of the finger can be serious especially if it gets into the joints or tendons. That is why it is important to see the hand surgeon if getting worse. Take Allegra daily for the drainage.  At Nighttime if needed may take Chlor-Trimeton 2 or 4 mg before bedtime or every 4 hours as needed. This is an antihistamine that can cause drowsiness and a stronger for  drainage. Drink plenty of water especially before bedtime and upon awakening.    Final Clinical Impressions(s) / UC Diagnoses   Final diagnoses:  PND (post-nasal drip)  Sore throat  Cellulitis of left ring finger    New Prescriptions New Prescriptions   CEPHALEXIN (KEFLEX) 500 MG CAPSULE    Take 1 capsule (500 mg total) by mouth 4 (four) times daily.     Controlled Substance Prescriptions   Controlled Substance Registry consulted? Not Applicable   Hayden Rasmussen, NP 07/10/17 1413

## 2017-07-10 NOTE — Discharge Instructions (Signed)
You are given an injection of an antibiotic today. Also pick up her prescription antibiotic at the pharmacy today and start taking it. Warm soaks to the finger. He did not need to put any medication on the finger. If it is getting worse in the next 2-3 days either return or call the hand specialist on this pain. They should be calling you for an appointment to see you soon. An infection of the finger can be serious especially if it gets into the joints or tendons. That is why it is important to see the hand surgeon if getting worse. Take Allegra daily for the drainage.  At Nighttime if needed may take Chlor-Trimeton 2 or 4 mg before bedtime or every 4 hours as needed. This is an antihistamine that can cause drowsiness and a stronger for drainage. Drink plenty of water especially before bedtime and upon awakening.

## 2017-07-10 NOTE — ED Triage Notes (Signed)
Left ring finger pain, swelling, and feverish.  Patient reports area had been sore for 2 weeks, then worsened over the last week.    Patient has a sore throat and cough and drainage in the back of throat.  Symptoms for a week.

## 2017-09-12 HISTORY — PX: CATARACT EXTRACTION W/ INTRAOCULAR LENS IMPLANT: SHX1309

## 2017-09-15 ENCOUNTER — Other Ambulatory Visit: Payer: Self-pay | Admitting: Family Medicine

## 2017-09-15 DIAGNOSIS — N63 Unspecified lump in unspecified breast: Secondary | ICD-10-CM

## 2017-09-21 ENCOUNTER — Ambulatory Visit: Payer: Medicare Other

## 2017-09-21 ENCOUNTER — Ambulatory Visit
Admission: RE | Admit: 2017-09-21 | Discharge: 2017-09-21 | Disposition: A | Discharge status: Home / Self Care | Payer: Medicare Other | Source: Ambulatory Visit | Attending: Family Medicine | Admitting: Family Medicine

## 2017-09-21 ENCOUNTER — Ambulatory Visit: Admission: RE | Admit: 2017-09-21 | Payer: Medicare Other | Source: Ambulatory Visit

## 2017-09-21 DIAGNOSIS — N63 Unspecified lump in unspecified breast: Secondary | ICD-10-CM

## 2018-09-12 DIAGNOSIS — Z8616 Personal history of COVID-19: Secondary | ICD-10-CM

## 2018-09-12 HISTORY — DX: Personal history of COVID-19: Z86.16

## 2019-04-26 ENCOUNTER — Other Ambulatory Visit: Payer: Self-pay | Admitting: Family Medicine

## 2019-04-26 DIAGNOSIS — R42 Dizziness and giddiness: Secondary | ICD-10-CM

## 2019-05-24 ENCOUNTER — Ambulatory Visit
Admission: RE | Admit: 2019-05-24 | Discharge: 2019-05-24 | Disposition: A | Discharge status: Home / Self Care | Payer: Medicare Other | Source: Ambulatory Visit | Attending: Family Medicine | Admitting: Family Medicine

## 2019-05-24 ENCOUNTER — Other Ambulatory Visit: Payer: Self-pay

## 2019-05-24 DIAGNOSIS — R42 Dizziness and giddiness: Secondary | ICD-10-CM

## 2019-05-24 MED ORDER — GADOBENATE DIMEGLUMINE 529 MG/ML IV SOLN
17.0000 mL | Freq: Once | INTRAVENOUS | Status: AC | PRN
  Administered 2019-05-24: 17 mL via INTRAVENOUS

## 2019-12-11 ENCOUNTER — Other Ambulatory Visit: Payer: Self-pay | Admitting: Family Medicine

## 2019-12-11 DIAGNOSIS — Z1231 Encounter for screening mammogram for malignant neoplasm of breast: Secondary | ICD-10-CM

## 2019-12-30 ENCOUNTER — Ambulatory Visit
Admission: RE | Admit: 2019-12-30 | Discharge: 2019-12-30 | Disposition: A | Discharge status: Home / Self Care | Payer: Medicare Other | Source: Ambulatory Visit | Attending: Family Medicine | Admitting: Family Medicine

## 2019-12-30 ENCOUNTER — Other Ambulatory Visit: Payer: Self-pay

## 2019-12-30 DIAGNOSIS — Z1231 Encounter for screening mammogram for malignant neoplasm of breast: Secondary | ICD-10-CM | POA: Diagnosis not present

## 2020-04-28 DIAGNOSIS — Z03818 Encounter for observation for suspected exposure to other biological agents ruled out: Secondary | ICD-10-CM | POA: Diagnosis not present

## 2020-04-28 DIAGNOSIS — R05 Cough: Secondary | ICD-10-CM | POA: Diagnosis not present

## 2020-04-28 DIAGNOSIS — B349 Viral infection, unspecified: Secondary | ICD-10-CM | POA: Diagnosis not present

## 2020-04-28 DIAGNOSIS — R5383 Other fatigue: Secondary | ICD-10-CM | POA: Diagnosis not present

## 2020-04-28 DIAGNOSIS — J45909 Unspecified asthma, uncomplicated: Secondary | ICD-10-CM | POA: Diagnosis not present

## 2020-05-20 DIAGNOSIS — J309 Allergic rhinitis, unspecified: Secondary | ICD-10-CM | POA: Diagnosis not present

## 2020-05-20 DIAGNOSIS — I10 Essential (primary) hypertension: Secondary | ICD-10-CM | POA: Diagnosis not present

## 2020-05-20 DIAGNOSIS — M7072 Other bursitis of hip, left hip: Secondary | ICD-10-CM | POA: Diagnosis not present

## 2020-05-27 ENCOUNTER — Ambulatory Visit (INDEPENDENT_AMBULATORY_CARE_PROVIDER_SITE_OTHER): Payer: Medicare PPO

## 2020-05-27 ENCOUNTER — Encounter: Payer: Self-pay | Admitting: Orthopaedic Surgery

## 2020-05-27 ENCOUNTER — Ambulatory Visit: Payer: Medicare PPO | Admitting: Orthopaedic Surgery

## 2020-05-27 DIAGNOSIS — M545 Low back pain, unspecified: Secondary | ICD-10-CM

## 2020-05-27 DIAGNOSIS — R102 Pelvic and perineal pain: Secondary | ICD-10-CM

## 2020-05-27 DIAGNOSIS — M5442 Lumbago with sciatica, left side: Secondary | ICD-10-CM | POA: Diagnosis not present

## 2020-05-27 DIAGNOSIS — G8929 Other chronic pain: Secondary | ICD-10-CM

## 2020-05-27 MED ORDER — METHYLPREDNISOLONE 4 MG PO TABS
ORAL_TABLET | ORAL | 0 refills | Status: DC
Start: 2020-05-27 — End: 2021-07-21

## 2020-05-27 MED ORDER — TRAMADOL HCL 50 MG PO TABS: 100.0000 mg | ORAL_TABLET | Freq: Four times a day (QID) | ORAL | 0 refills | Status: DC | PRN

## 2020-05-27 NOTE — Progress Notes (Signed)
Office Visit Note   Patient: Tiffany Scott [facility]           Date of Birth: 1951/02/05           MRN: [id number] Visit Date: 05/27/2020              Requested by: Maurice Small, MD [address] [place],  [place] [address] PCP: Maurice Small, MD   Assessment & Plan: Visit Diagnoses:  1. Chronic right-sided low back pain, unspecified whether sciatica present   2. Pain in pelvis   3. Acute left-sided low back pain with left-sided sciatica     Plan: I would like to try combination of a 6-day steroid taper with Voltaren gel and some tramadol to see if this will help her.  She agrees with this treatment plan.  I talked about the possibility of sending her to Dr. Alvester Morin for facet joint injections or even a trigger point injection and the possibility of outpatient physical therapy.  She would like to try just the medicines and time first.  We will see her back in about 2 weeks to see how she is doing overall.  All questions and concerns were answered and addressed.  Follow-Up Instructions: Return in about 2 weeks (around 06/10/2020).   Orders:  Orders Placed This Encounter  Procedures  . XR Lumbar Spine 2-3 Views   Meds ordered this encounter  Medications  . methylPREDNISolone (MEDROL) 4 MG tablet    Sig: Medrol dose pack. Take as instructed    Dispense:  21 tablet    Refill:  0  . traMADol (ULTRAM) 50 MG tablet    Sig: Take 2 tablets (100 mg total) by mouth every 6 (six) hours as needed.    Dispense:  30 tablet    Refill:  0      Procedures: No procedures performed   Clinical Data: No additional findings.   Subjective: Chief Complaint  Patient presents with  . Lower Back - Pain  Patient is a very pleasant 69 year old female who comes in with a 1 month history of right hip pain and she says is in her hip joint but she points to the low back on the left side as a source of her pain and it does radiate across her back as well.  She points to one area on the  pelvis that is most painful to her.  She is someone who is morbidly obese she is not a diabetic and she is not injured herself that she is aware of.  She says is painful to lay on that side at night as well as painful when she gets up and down out of a chair and out of bed.  She denies any groin pain.  She denies any numbness and tingling in her feet.  HPI  Review of Systems There is currently no listed headache, chest pain, shortness of breath, fever, chills, nausea, vomiting  Objective: Vital Signs: There were no vitals taken for this visit.  Physical Exam She is alert and orient x3 and in no acute distress Ortho Exam Examination of her left hip is normal with fluid range of motion around her hip.  There is no pain of the trochanteric area.  She has pain along the lower aspect of the lumbar spine right and left-sided as well as left-sided sciatic pain in the area where she is point tender along the posterior pelvis area on the left side. Specialty Comments:  No specialty comments  available.  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    PMFS History: There are no problems to display for this patient.  Past Medical History:  Diagnosis Date  . Hypertension     Family History  Problem Relation Age of Onset  . Breast cancer Sister 48    History reviewed. No pertinent surgical history. Social History   Occupational History  . Not on file  Tobacco Use  . Smoking status: Never Smoker  . Smokeless tobacco: Never Used  Substance and Sexual Activity  . Alcohol use: No  . Drug use: No  . Sexual activity: Not on file

## 2020-06-10 ENCOUNTER — Ambulatory Visit: Payer: Medicare PPO | Admitting: Orthopaedic Surgery

## 2020-06-15 ENCOUNTER — Ambulatory Visit (INDEPENDENT_AMBULATORY_CARE_PROVIDER_SITE_OTHER): Payer: Medicare PPO | Admitting: Orthopaedic Surgery

## 2020-06-15 ENCOUNTER — Encounter: Payer: Self-pay | Admitting: Orthopaedic Surgery

## 2020-06-15 DIAGNOSIS — M5442 Lumbago with sciatica, left side: Secondary | ICD-10-CM | POA: Diagnosis not present

## 2020-06-15 NOTE — Progress Notes (Signed)
The patient comes in today 2 weeks after I saw her for an acute flareup of left-sided low back pain.  This seems to be more facet joint mediated.  She reports that after the steroid taper and some tramadol things have calmed down quite a bit.  She says she gets up better and is just a occasional nagging pain.  She is able to get out of the chair easier.  She has negative straight leg raise to the left side.  There is pain to palpation along the left side of her low back and pelvis with remainder of exam is normal.  At this point follow-up can be as needed since she is doing well.  Of advocated weight loss and recommended outpatient physical therapy if things worsen again.  We would also obtain MRI of the lumbar spine.  All question concerns were answered addressed.  Follow-up can be as needed.

## 2020-08-13 DIAGNOSIS — H52202 Unspecified astigmatism, left eye: Secondary | ICD-10-CM | POA: Diagnosis not present

## 2020-08-13 DIAGNOSIS — H52213 Irregular astigmatism, bilateral: Secondary | ICD-10-CM | POA: Diagnosis not present

## 2020-08-13 DIAGNOSIS — H04123 Dry eye syndrome of bilateral lacrimal glands: Secondary | ICD-10-CM | POA: Diagnosis not present

## 2020-08-13 DIAGNOSIS — H26493 Other secondary cataract, bilateral: Secondary | ICD-10-CM | POA: Diagnosis not present

## 2020-08-13 DIAGNOSIS — H43811 Vitreous degeneration, right eye: Secondary | ICD-10-CM | POA: Diagnosis not present

## 2020-08-13 DIAGNOSIS — Z961 Presence of intraocular lens: Secondary | ICD-10-CM | POA: Diagnosis not present

## 2020-08-13 DIAGNOSIS — H524 Presbyopia: Secondary | ICD-10-CM | POA: Diagnosis not present

## 2020-08-31 DIAGNOSIS — D1800 Hemangioma unspecified site: Secondary | ICD-10-CM | POA: Diagnosis not present

## 2020-08-31 DIAGNOSIS — Z23 Encounter for immunization: Secondary | ICD-10-CM | POA: Diagnosis not present

## 2020-11-19 DIAGNOSIS — D649 Anemia, unspecified: Secondary | ICD-10-CM | POA: Diagnosis not present

## 2020-11-19 DIAGNOSIS — M5430 Sciatica, unspecified side: Secondary | ICD-10-CM | POA: Diagnosis not present

## 2020-11-19 DIAGNOSIS — I10 Essential (primary) hypertension: Secondary | ICD-10-CM | POA: Diagnosis not present

## 2020-11-24 ENCOUNTER — Other Ambulatory Visit: Payer: Self-pay | Admitting: Family Medicine

## 2020-11-24 DIAGNOSIS — M5430 Sciatica, unspecified side: Secondary | ICD-10-CM

## 2020-11-27 ENCOUNTER — Other Ambulatory Visit: Payer: Self-pay | Admitting: Family Medicine

## 2020-11-27 DIAGNOSIS — Z1231 Encounter for screening mammogram for malignant neoplasm of breast: Secondary | ICD-10-CM

## 2020-12-01 ENCOUNTER — Other Ambulatory Visit: Payer: Medicare PPO

## 2020-12-01 ENCOUNTER — Ambulatory Visit
Admission: RE | Admit: 2020-12-01 | Discharge: 2020-12-01 | Disposition: A | Discharge status: Home / Self Care | Payer: Medicare PPO | Source: Ambulatory Visit | Attending: Family Medicine | Admitting: Family Medicine

## 2020-12-01 ENCOUNTER — Other Ambulatory Visit: Payer: Self-pay

## 2020-12-01 DIAGNOSIS — M48061 Spinal stenosis, lumbar region without neurogenic claudication: Secondary | ICD-10-CM | POA: Diagnosis not present

## 2020-12-01 DIAGNOSIS — M545 Low back pain, unspecified: Secondary | ICD-10-CM | POA: Diagnosis not present

## 2020-12-01 DIAGNOSIS — M5430 Sciatica, unspecified side: Secondary | ICD-10-CM

## 2020-12-08 DIAGNOSIS — Z6838 Body mass index (BMI) 38.0-38.9, adult: Secondary | ICD-10-CM | POA: Diagnosis not present

## 2020-12-08 DIAGNOSIS — M5126 Other intervertebral disc displacement, lumbar region: Secondary | ICD-10-CM | POA: Diagnosis not present

## 2020-12-08 DIAGNOSIS — I10 Essential (primary) hypertension: Secondary | ICD-10-CM | POA: Diagnosis not present

## 2020-12-24 DIAGNOSIS — M5126 Other intervertebral disc displacement, lumbar region: Secondary | ICD-10-CM | POA: Diagnosis not present

## 2020-12-24 DIAGNOSIS — M5416 Radiculopathy, lumbar region: Secondary | ICD-10-CM | POA: Diagnosis not present

## 2021-01-07 DIAGNOSIS — I10 Essential (primary) hypertension: Secondary | ICD-10-CM | POA: Diagnosis not present

## 2021-01-07 DIAGNOSIS — K219 Gastro-esophageal reflux disease without esophagitis: Secondary | ICD-10-CM | POA: Diagnosis not present

## 2021-01-07 DIAGNOSIS — Z0000 Encounter for general adult medical examination without abnormal findings: Secondary | ICD-10-CM | POA: Diagnosis not present

## 2021-01-07 DIAGNOSIS — J309 Allergic rhinitis, unspecified: Secondary | ICD-10-CM | POA: Diagnosis not present

## 2021-01-07 DIAGNOSIS — M5442 Lumbago with sciatica, left side: Secondary | ICD-10-CM | POA: Diagnosis not present

## 2021-01-07 DIAGNOSIS — Z136 Encounter for screening for cardiovascular disorders: Secondary | ICD-10-CM | POA: Diagnosis not present

## 2021-01-07 DIAGNOSIS — D649 Anemia, unspecified: Secondary | ICD-10-CM | POA: Diagnosis not present

## 2021-01-21 ENCOUNTER — Other Ambulatory Visit: Payer: Self-pay

## 2021-01-21 ENCOUNTER — Ambulatory Visit
Admission: RE | Admit: 2021-01-21 | Discharge: 2021-01-21 | Disposition: A | Discharge status: Home / Self Care | Payer: Medicare PPO | Source: Ambulatory Visit | Attending: Family Medicine | Admitting: Family Medicine

## 2021-01-21 DIAGNOSIS — Z1231 Encounter for screening mammogram for malignant neoplasm of breast: Secondary | ICD-10-CM | POA: Diagnosis not present

## 2021-01-29 DIAGNOSIS — D649 Anemia, unspecified: Secondary | ICD-10-CM | POA: Diagnosis not present

## 2021-01-30 DIAGNOSIS — N939 Abnormal uterine and vaginal bleeding, unspecified: Secondary | ICD-10-CM | POA: Diagnosis not present

## 2021-01-30 DIAGNOSIS — R9389 Abnormal findings on diagnostic imaging of other specified body structures: Secondary | ICD-10-CM | POA: Diagnosis not present

## 2021-01-30 DIAGNOSIS — I10 Essential (primary) hypertension: Secondary | ICD-10-CM | POA: Insufficient documentation

## 2021-01-30 DIAGNOSIS — N950 Postmenopausal bleeding: Secondary | ICD-10-CM | POA: Diagnosis not present

## 2021-01-30 DIAGNOSIS — N719 Inflammatory disease of uterus, unspecified: Secondary | ICD-10-CM | POA: Diagnosis not present

## 2021-01-31 ENCOUNTER — Emergency Department (HOSPITAL_COMMUNITY): Payer: Medicare PPO

## 2021-01-31 ENCOUNTER — Emergency Department (HOSPITAL_COMMUNITY)
Admission: EM | Admit: 2021-01-31 | Discharge: 2021-01-31 | Disposition: A | Discharge status: Home / Self Care | Payer: Medicare PPO | Attending: Emergency Medicine | Admitting: Emergency Medicine

## 2021-01-31 ENCOUNTER — Encounter (HOSPITAL_COMMUNITY): Payer: Self-pay

## 2021-01-31 ENCOUNTER — Other Ambulatory Visit: Payer: Self-pay

## 2021-01-31 DIAGNOSIS — N939 Abnormal uterine and vaginal bleeding, unspecified: Secondary | ICD-10-CM

## 2021-01-31 DIAGNOSIS — N719 Inflammatory disease of uterus, unspecified: Secondary | ICD-10-CM

## 2021-01-31 DIAGNOSIS — R9389 Abnormal findings on diagnostic imaging of other specified body structures: Secondary | ICD-10-CM | POA: Diagnosis not present

## 2021-01-31 DIAGNOSIS — N950 Postmenopausal bleeding: Secondary | ICD-10-CM | POA: Diagnosis not present

## 2021-01-31 LAB — COMPREHENSIVE METABOLIC PANEL
ALT: 17 U/L (ref 0–44)
AST: 21 U/L (ref 15–41)
Albumin: 3.1 g/dL — ABNORMAL LOW (ref 3.5–5.0)
Alkaline Phosphatase: 172 U/L — ABNORMAL HIGH (ref 38–126)
Anion gap: 11 (ref 5–15)
BUN: 15 mg/dL (ref 8–23)
CO2: 28 mmol/L (ref 22–32)
Calcium: 9 mg/dL (ref 8.9–10.3)
Chloride: 100 mmol/L (ref 98–111)
Creatinine, Ser: 0.79 mg/dL (ref 0.44–1.00)
GFR, Estimated: >60 mL/min (ref >60)
Glucose, Bld: 118 mg/dL — ABNORMAL HIGH (ref 70–99)
Potassium: 3.6 mmol/L (ref 3.5–5.1)
Sodium: 139 mmol/L (ref 135–145)
Total Bilirubin: <0.1 mg/dL — ABNORMAL LOW (ref 0.3–1.2)
Total Protein: 7.9 g/dL (ref 6.5–8.1)

## 2021-01-31 LAB — CBC WITH DIFFERENTIAL/PLATELET
Abs Immature Granulocytes: 0.03 10*3/uL (ref 0.00–0.07)
Basophils Absolute: 0 10*3/uL (ref 0.0–0.1)
Basophils Relative: 0 %
Eosinophils Absolute: 0.1 10*3/uL (ref 0.0–0.5)
Eosinophils Relative: 1 %
HCT: 34.2 % — ABNORMAL LOW (ref 36.0–46.0)
Hemoglobin: 10.6 g/dL — ABNORMAL LOW (ref 12.0–15.0)
Immature Granulocytes: 0 %
Lymphocytes Relative: 35 %
Lymphs Abs: 3.3 10*3/uL (ref 0.7–4.0)
MCH: 27.7 pg (ref 26.0–34.0)
MCHC: 31 g/dL (ref 30.0–36.0)
MCV: 89.5 fL (ref 80.0–100.0)
Monocytes Absolute: 0.5 10*3/uL (ref 0.1–1.0)
Monocytes Relative: 6 %
Neutro Abs: 5.5 10*3/uL (ref 1.7–7.7)
Neutrophils Relative %: 58 %
Platelets: 339 10*3/uL (ref 150–400)
RBC: 3.82 MIL/uL — ABNORMAL LOW (ref 3.87–5.11)
RDW: 14.5 % (ref 11.5–15.5)
WBC: 9.4 10*3/uL (ref 4.0–10.5)
nRBC: 0 % (ref 0.0–0.2)

## 2021-01-31 LAB — URINALYSIS, ROUTINE W REFLEX MICROSCOPIC
Bilirubin Urine: NEGATIVE
Glucose, UA: NEGATIVE mg/dL
Ketones, ur: NEGATIVE mg/dL
Nitrite: NEGATIVE
Protein, ur: NEGATIVE mg/dL
Specific Gravity, Urine: 1.005 (ref 1.005–1.030)
pH: 6 (ref 5.0–8.0)

## 2021-01-31 NOTE — ED Provider Notes (Signed)
[facility] EMERGENCY DEPARTMENT Provider Note   CSN: [id number] Arrival date & time: 01/30/21  2343     History Chief Complaint  Patient presents with  . Vaginal Bleeding    Tiffany Scott is a 70 y.o. female with history of hypertension presents to the ED for evaluation of sudden onset, painless vaginal bleeding that began last evening around 9 PM.  Felt a little twinge of a cramp in her lower abdomen then noticed bright red blood on the toilet paper when she wiped after urinating.  The bleeding has since increased in volume and has been constant.  She has put on a pad so far this morning and has not had to change it.  No blood clots.  Reports occasional cramping like she is having her menstrual cycle but very mild.  Reports history of bilateral tubal ligation when she was 21 although she is not very sure if this is a procedure that she had.  She has never been pregnant.  Vaguely remembers something about fibroids but it has been a very long time and has never had issues.  Used to have heavy menstrual cycles.  Last menses was around 54.  Denies fevers, chest pain, shortness of breath.  No dysuria.  No interventions. HPI     Past Medical History:  Diagnosis Date  . Hypertension     There are no problems to display for this patient.   History reviewed. No pertinent surgical history.   OB History   No obstetric history on file.     Family History  Problem Relation Age of Onset  . Breast cancer Sister 74    Social History   Tobacco Use  . Smoking status: Never Smoker  . Smokeless tobacco: Never Used  Substance Use Topics  . Alcohol use: No  . Drug use: No    Home Medications Prior to Admission medications   Medication Sig Start Date End Date Taking? Authorizing Provider  acetaminophen (TYLENOL) 325 MG tablet Take 650 mg by mouth every 6 (six) hours as needed for pain.    [provider]  cephALEXin (KEFLEX) 500 MG capsule Take 1  capsule (500 mg total) by mouth 4 (four) times daily. 07/10/17   Hayden Rasmussen, NP  fluticasone (FLONASE) 50 MCG/ACT nasal spray Place 2 sprays into both nostrils daily. 05/09/14   Ethelda Chick, MD  ibuprofen (ADVIL,MOTRIN) 200 MG tablet Take 400 mg by mouth every 6 (six) hours as needed for pain.    [provider]  Loratadine 10 MG CAPS Take 1 capsule (10 mg total) by mouth daily. 05/09/14   Ethelda Chick, MD  LOSARTAN POTASSIUM PO Take by mouth.    [provider]  methylPREDNISolone (MEDROL) 4 MG tablet Medrol dose pack. Take as instructed 05/27/20   Kathryne Hitch, MD  Multiple Vitamins-Minerals (MULTIVITAMIN WITH MINERALS) tablet Take 1 tablet by mouth daily.    [provider]  traMADol (ULTRAM) 50 MG tablet Take 2 tablets (100 mg total) by mouth every 6 (six) hours as needed. 05/27/20   Kathryne Hitch, MD    Allergies    Ace inhibitors  Review of Systems   Review of Systems  Genitourinary: Positive for vaginal bleeding.  All other systems reviewed and are negative.   Physical Exam Updated Vital Signs BP (!) 146/95   Pulse 62   Temp 97.7 F (36.5 C) (Oral)   Resp 14   Ht 5\' 3"  (1.6 m)  Wt 107 kg   SpO2 98%   BMI 41.79 kg/m   Physical Exam Constitutional:      Appearance: She is well-developed.  HENT:     Head: Normocephalic.     Nose: Nose normal.  Eyes:     General: Lids are normal.  Cardiovascular:     Rate and Rhythm: Normal rate.  Pulmonary:     Effort: Pulmonary effort is normal. No respiratory distress.  Genitourinary:    Cervix: Cervical bleeding present.     Comments:  Exam performed with EMT at bedside for assistance. External genitalia without lesions.  No groin lymphadenopathy. Visualized cervix with is nulliparous, some oozing of dark blood mild.  Vaginal mucosa and cervix pink without lesions.  No CMT.  Nonpalpable, nontender adnexa.  Large but non tender hemorrhoid at 6 o'clock, no signs of bleeding   Musculoskeletal:        General: Normal range of motion.     Cervical back: Normal range of motion.  Neurological:     Mental Status: She is alert.  Psychiatric:        Behavior: Behavior normal.     ED Results / Procedures / Treatments   Labs (all labs ordered are listed, but only abnormal results are displayed) Labs Reviewed  CBC WITH DIFFERENTIAL/PLATELET - Abnormal; Notable for the following components:      Result Value   RBC 3.82 (*)    Hemoglobin 10.6 (*)    HCT 34.2 (*)    All other components within normal limits  COMPREHENSIVE METABOLIC PANEL - Abnormal; Notable for the following components:   Glucose, Bld 118 (*)    Albumin 3.1 (*)    Alkaline Phosphatase 172 (*)    Total Bilirubin <0.1 (*)    All other components within normal limits  URINALYSIS, ROUTINE W REFLEX MICROSCOPIC - Abnormal; Notable for the following components:   Color, Urine STRAW (*)    Hgb urine dipstick LARGE (*)    Leukocytes,Ua SMALL (*)    Bacteria, UA RARE (*)    All other components within normal limits    EKG None  Radiology No results found.  Procedures Procedures   Medications Ordered in ED Medications - No data to display  ED Course  I have reviewed the triage vital signs and the nursing notes.  Pertinent labs & imaging results that were available during my care of the patient were reviewed by me and considered in my medical decision making (see chart for details).    MDM Rules/Calculators/A&P                          70 yo F here with painless vaginal bleeding onset last night.  No source of bleeding on pelvic, bleeding is mild. Labs ordered by [facility] provider, reviewed. Mild anemia, unknown baseline but patient has history of anemia and on iron supplements.  UA with RBCs, WBC, rare bacteri and small leukcytes. She has no UTI symptoms.    Given age, patient concern will obtain TVUS.  Concern for painless vaginal bleeding is postmenopausal women, malignancy. Discussed with  EDP who agrees with TVUS at this time.   1055: Patient care transferred to oncoming EDPA who will follow up on imaging, update patient, determine disposition. Will need GYN referral.  Final Clinical Impression(s) / ED Diagnoses Final diagnoses:  Vaginal bleeding    Rx / DC Orders ED Discharge Orders    None  Liberty Handy, PA-C 01/31/21 1053    Gerhard Munch, MD 01/31/21 [phone]

## 2021-01-31 NOTE — ED Notes (Signed)
Pt's family stated that the pt is bleeding again.

## 2021-01-31 NOTE — Discharge Instructions (Addendum)
Your ultrasound shows thickening of the endometrium but does not give a definite diagnosis that explains your vaginal bleeding. You will need to see a gynecologist for further testing. It is important that you make an appointment with Dr. Sallye Ober, or if you prefer to contact your primary care doctor as we talked about and get her recommendation for follow up.   Return to the ED if you have any new or worsening symptoms at any time.

## 2021-01-31 NOTE — ED Provider Notes (Signed)
Vaginal bleeding x 1 day, mild Pelvic exam confirms cervical bleeding Labs stable.  Pending [place]  US shows thickening of the endometrium without visualization of discrete mass. These results were explained to the patient and her husband and the importance of follow up with GYN for biopsy and further evaluation was stressed.   Discussed symptoms that might prompt return to the ED.     Elpidio Anis, PA-C 01/31/21 1213    Gerhard Munch, MD 01/31/21 1550

## 2021-01-31 NOTE — ED Notes (Signed)
Patient transported to Ultrasound 

## 2021-01-31 NOTE — ED Provider Notes (Signed)
MSE was initiated and I personally evaluated the patient and placed orders (if any) at  12:10 AM on Jan 31, 2021.  Patient with CC of vaginal bleeding that started tonight.  Denies having any pain.  She denies fever or dysuria.    ROS: As listed above  PE: Alert and oriented Answers questions appropriately No respiratory distress   Discussed with patient that their care has been initiated.   They are counseled that they will need to remain in the ED until the completion of their workup, including full H&P and results of any tests.  Risks of leaving the emergency department prior to completion of treatment were discussed. Patient was advised to inform ED staff if they are leaving before their treatment is complete. The patient acknowledged these risks and time was allowed for questions.    The patient appears stable so that the remainder of the MSE may be completed by another provider.    Roxy Horseman, PA-C 01/31/21 0011    Palumbo, April, MD 01/31/21 [phone]

## 2021-01-31 NOTE — ED Notes (Signed)
Pelvic cart at the bedside 

## 2021-01-31 NOTE — ED Triage Notes (Signed)
Vaginal bleeding (dark red blood) and cramping started about 2 hours ago. Denies any blood thinners, recent trauma or surgeries.

## 2021-02-03 DIAGNOSIS — R9389 Abnormal findings on diagnostic imaging of other specified body structures: Secondary | ICD-10-CM | POA: Diagnosis not present

## 2021-02-03 DIAGNOSIS — N858 Other specified noninflammatory disorders of uterus: Secondary | ICD-10-CM | POA: Diagnosis not present

## 2021-02-03 DIAGNOSIS — N950 Postmenopausal bleeding: Secondary | ICD-10-CM | POA: Diagnosis not present

## 2021-02-10 DIAGNOSIS — R9389 Abnormal findings on diagnostic imaging of other specified body structures: Secondary | ICD-10-CM | POA: Diagnosis not present

## 2021-02-10 DIAGNOSIS — N950 Postmenopausal bleeding: Secondary | ICD-10-CM | POA: Diagnosis not present

## 2021-02-11 DIAGNOSIS — M5416 Radiculopathy, lumbar region: Secondary | ICD-10-CM | POA: Diagnosis not present

## 2021-02-24 DIAGNOSIS — R300 Dysuria: Secondary | ICD-10-CM | POA: Diagnosis not present

## 2021-02-24 DIAGNOSIS — N766 Ulceration of vulva: Secondary | ICD-10-CM | POA: Diagnosis not present

## 2021-02-24 DIAGNOSIS — N9089 Other specified noninflammatory disorders of vulva and perineum: Secondary | ICD-10-CM | POA: Diagnosis not present

## 2021-02-24 DIAGNOSIS — A609 Anogenital herpesviral infection, unspecified: Secondary | ICD-10-CM | POA: Diagnosis not present

## 2021-03-08 DIAGNOSIS — M5126 Other intervertebral disc displacement, lumbar region: Secondary | ICD-10-CM | POA: Diagnosis not present

## 2021-03-08 DIAGNOSIS — M5416 Radiculopathy, lumbar region: Secondary | ICD-10-CM | POA: Diagnosis not present

## 2021-03-08 DIAGNOSIS — I10 Essential (primary) hypertension: Secondary | ICD-10-CM | POA: Diagnosis not present

## 2021-03-11 DIAGNOSIS — K649 Unspecified hemorrhoids: Secondary | ICD-10-CM | POA: Diagnosis not present

## 2021-03-11 DIAGNOSIS — A609 Anogenital herpesviral infection, unspecified: Secondary | ICD-10-CM | POA: Diagnosis not present

## 2021-03-26 DIAGNOSIS — K649 Unspecified hemorrhoids: Secondary | ICD-10-CM | POA: Diagnosis not present

## 2021-03-26 DIAGNOSIS — D649 Anemia, unspecified: Secondary | ICD-10-CM | POA: Diagnosis not present

## 2021-03-29 DIAGNOSIS — A609 Anogenital herpesviral infection, unspecified: Secondary | ICD-10-CM | POA: Diagnosis not present

## 2021-04-29 DIAGNOSIS — A609 Anogenital herpesviral infection, unspecified: Secondary | ICD-10-CM | POA: Diagnosis not present

## 2021-05-20 DIAGNOSIS — N950 Postmenopausal bleeding: Secondary | ICD-10-CM | POA: Diagnosis not present

## 2021-05-20 DIAGNOSIS — B009 Herpesviral infection, unspecified: Secondary | ICD-10-CM | POA: Diagnosis not present

## 2021-06-07 DIAGNOSIS — N950 Postmenopausal bleeding: Secondary | ICD-10-CM | POA: Diagnosis not present

## 2021-06-10 DIAGNOSIS — Z6838 Body mass index (BMI) 38.0-38.9, adult: Secondary | ICD-10-CM | POA: Diagnosis not present

## 2021-06-10 DIAGNOSIS — I10 Essential (primary) hypertension: Secondary | ICD-10-CM | POA: Diagnosis not present

## 2021-06-24 DIAGNOSIS — M5416 Radiculopathy, lumbar region: Secondary | ICD-10-CM | POA: Diagnosis not present

## 2021-07-06 DIAGNOSIS — N950 Postmenopausal bleeding: Secondary | ICD-10-CM | POA: Diagnosis not present

## 2021-07-06 DIAGNOSIS — R9389 Abnormal findings on diagnostic imaging of other specified body structures: Secondary | ICD-10-CM | POA: Diagnosis not present

## 2021-07-15 DIAGNOSIS — Z23 Encounter for immunization: Secondary | ICD-10-CM | POA: Diagnosis not present

## 2021-07-15 DIAGNOSIS — Z01818 Encounter for other preprocedural examination: Secondary | ICD-10-CM | POA: Diagnosis not present

## 2021-07-15 DIAGNOSIS — I10 Essential (primary) hypertension: Secondary | ICD-10-CM | POA: Diagnosis not present

## 2021-07-15 DIAGNOSIS — R9389 Abnormal findings on diagnostic imaging of other specified body structures: Secondary | ICD-10-CM | POA: Diagnosis not present

## 2021-07-15 DIAGNOSIS — D649 Anemia, unspecified: Secondary | ICD-10-CM | POA: Diagnosis not present

## 2021-07-21 ENCOUNTER — Encounter (HOSPITAL_BASED_OUTPATIENT_CLINIC_OR_DEPARTMENT_OTHER): Payer: Self-pay | Admitting: Obstetrics and Gynecology

## 2021-07-22 ENCOUNTER — Other Ambulatory Visit: Payer: Self-pay | Admitting: Obstetrics and Gynecology

## 2021-07-23 ENCOUNTER — Other Ambulatory Visit: Payer: Self-pay

## 2021-07-23 ENCOUNTER — Encounter (HOSPITAL_BASED_OUTPATIENT_CLINIC_OR_DEPARTMENT_OTHER): Payer: Self-pay | Admitting: Obstetrics and Gynecology

## 2021-07-23 NOTE — Progress Notes (Signed)
Spoke w/ via phone for pre-op interview--- pt Lab needs dos----  [employer] and ekg (per anes);  per dr Richardson Dopp order Hg/ Hct   (sent inbox message to dr Richardson Dopp in epic , questioned if istat ok to do or if she wants blood draw)          Lab results------ no COVID test -----patient states asymptomatic no test needed Arrive at ------- 1200 on 07-27-2021 NPO after MN NO Solid Food.  Clear liquids from MN until--- 1100 Med rec completed Medications to take morning of surgery ----- none Diabetic medication ----- n/a Patient instructed no nail polish to be worn day of surgery Patient instructed to bring photo id and insurance card day of surgery Patient aware to have Driver (ride ) / caregiver for 24 hours after surgery --husband, billy Patient Special Instructions ----- n/a Pre-Op special Istructions ----- n/a Patient verbalized understanding of instructions that were given at this phone interview. Patient denies shortness of breath, chest pain, fever, cough at this phone interview.

## 2021-07-26 ENCOUNTER — Other Ambulatory Visit: Payer: Self-pay | Admitting: Obstetrics and Gynecology

## 2021-07-26 DIAGNOSIS — M5416 Radiculopathy, lumbar region: Secondary | ICD-10-CM | POA: Diagnosis not present

## 2021-07-26 NOTE — Progress Notes (Signed)
Patient aware she is arrive at 0815 on 07/27/2021 due to surgery time change. NPO after MN except for clear liquids until 0715.

## 2021-07-26 NOTE — H&P (Signed)
Reason for Appointment  1. Discuss u/s result/Hysteroscopy D/C   History of Present Illness  Isolation Precautions:  Has patient received COVID-19 vaccination? Yes- Pfizer , Yes- Moderna. Does patient report new onset of COVID symptoms? No. Has patient or close contact tested positive for COVID-19? No , not in the past 2 weeks.  General:  70 yo presents to discuss [place] results and discuss possible hysteroscopy D/C.  Pt was seen on 02/03/2021 for a consultation for evaluation of postmenopausal bleeding. She began having heavy bleeding that past weekend. she was seen at [facility]. A pelvic ultrasound was performed. . On ultrasound Pt's endometrial lining is 11.5 mm, but it should be less than 4 mm. Pt is informed of options for the evaluation of endometrium: EMB, D&C. Pt had EMB performed at this time. [place] was done on 01/31/2021. US shows 8.9 x 3.8 x 5.1 cm, endometrium is thickened at 11.39mm.  Pt was seen on 02/10/2021 and discussed how pathology revealed atrophic endometrium with abundant blood, no hyperplasia or malignancy was seen. Pt was prescribed Provera 10 mg daily for PMB.  Pt was seen 05/20/2021 and denied PMB for the last two months after she took provera. 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 This outbreak was severe and required her to take valtrex for 3 weeks.  Ultrasound Results Completed on September 26th 2022  Uterus: 6.6 cm X 3.3 cm X 4.5 cm  Endometrium: 8.1 mm  L Ovary: not seen  R Ovary: WNL Findings: UT fibroid- fundal- 1.7 cm X 1.3 cm X 1.9 cm. Endo appears thickened. No free fluid or adnexal masses seen.  TODAY:  Given [place] results completed on Sept 26th 2022, recommend hysteroscopy D/C with possible polypectomy.   Current Medications  Taking   Lidocaine 5 % Ointment 1 application as needed Externally Three times a day  valACYclovir HCl 1 GM Tablet 1 tablet Orally twice a day  valACYclovir HCl 500 MG  Tablet 1 tablet Orally twice a day for 5 days, starting at first sign of outbreak  Valtrex(valACYclovir HCl) 1 GM Tablet 1 tablet Orally Once a day  Losartan Potassium 100 MG Tablet 1 tablet Orally Once a day  hydroCHLOROthiazide 12.5 MG Tablet 1 tablet in the morning Orally Once a day  Flonase(Fluticasone Propionate) 50 MCG/ACT Suspension 1 spray in each nostril Nasally Once a day  ZyrTEC(Cetirizine HCl) 10 MG Tablet 1 tablet Orally Once a day  traMADol HCl 50 MG Tablet 1 tablet as needed Orally twice a day as needed, Notes: prn  Iron (Ferrous Sulfate) 325 (65 Fe) MG Tablet 1 tablet Orally Once a day  Montelukast Sodium 10 MG Tablet 1 tablet Orally Once a day , Notes: prn  Tylenol(APAP) 325 MG Tablet 2 tablets as needed Orally every 6 hrs, Notes: PRN  Discontinued   Losartan Potassium-HCTZ 100-12.5 MG Tablet TAKE 1 TABLET BY MOUTH EVERY DAY IN THE MORNING   Provera(medroxyPROGESTERone) 10 MG Tablet 1 tablet with food Orally Once a day  Medication List reviewed and reconciled with the patient   Past Medical History  HTN.   Allergies.   IBS.   Covid-19 2020.    Surgical History  Fallopian tubes and tumor/fibroids removed in her 20s   colonoscopy incomplete 2013  colonoscopy 08/2015   Family History  Father: deceased, unknown  Mother: deceased, CVA, diagnosed with Hypertension  Brother 1: alive  Sister 1: alive, HTN, diagnosed with Hypertension,  Breast cancer  Sister 2: alive, diagnosed with Hypertension  Sister 3: alive, diagnosed with Hypertension  1 brother(s) , 3 sister(s) .   neg gi family hx Sister recently passed.   Social History  General:  Tobacco use cigarettes: Never smoked, Tobacco history last updated 07/06/2021, Vaping No. no Alcohol. no Recreational drug use. Exercise: intermittent. Marital Status: married. Children: none. 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    Gyn History  Sexual activity not currently  sexually active.  Denies H/O Periods :.  LMP PMB, random spotting.  Birth control bilateral salpingectomy.  Last mammogram date 01/21/2021 - normal.  Denies H/O Abnormal pap smear.  STD HSV2.  GYN procedures bilateral salpingectomy, EMB.    OB History  Never been pregnant per patient.    Allergies  ACE Inhibitors: cough - Side Effects   Hospitalization/Major Diagnostic Procedure  none in the past yr 03/2021   Review of Systems  CONSTITUTIONAL:  Chills No. Fatigue No. Fever No. Night sweats No. Recent travel outside [place] No. Sweats No. Weight change No.  OPHTHALMOLOGY:  Blurring of vision no. Change in vision no. Double vision no.  ENT:  Dizziness no. Nose bleeds no. Sore throat no. Teeth pain no.  ALLERGY:  Hives no.  CARDIOLOGY:  Chest pain no. High blood pressure no. Irregular heart beat no. Leg edema no. Palpitations no.  RESPIRATORY:  Shortness of breath no. Cough no. Wheezing no.  UROLOGY:  Pain with urination no. Urinary urgency no. Urinary frequency no. Urinary incontinence no. Difficulty urinating No. Blood in urine No.  GASTROENTEROLOGY:  Abdominal pain no. Appetite change no. Bloating/belching no. Blood in stool or on toilet paper no. Change in bowel movements no. Constipation no. Diarrhea no. Difficulty swallowing no. Nausea no.  FEMALE REPRODUCTIVE:  Vulvar pain no. Vulvar rash no. Abnormal vaginal bleeding yes. Breast pain no. Nipple discharge no. Pain with intercourse no. Pelvic pain no. Unusual vaginal discharge no. Vaginal itching no.  MUSCULOSKELETAL:  Muscle aches no.  NEUROLOGY:  Headache no. Tingling/numbness no. Weakness no.  PSYCHOLOGY:  Depression no. Anxiety no. Nervousness no. Sleep disturbances no. Suicidal ideation no .  ENDOCRINOLOGY:  Excessive thirst no. Excessive urination no. Hair loss no. Heat or cold intolerance no.  HEMATOLOGY/LYMPH:  Abnormal bleeding no. Easy bruising no. Swollen glands no.  DERMATOLOGY:  New/changing skin lesion no.  Rash no. Sores no.    Vital Signs  Wt 227.8, Wt change -0.4 lbs, Ht 63, BMI 40.35, Pulse sitting 56, BP sitting 150/86.   Examination  General Examination: CONSTITUTIONAL: alert, oriented, NAD. SKIN: moist, warm. EYES: Conjunctiva clear. LUNGS: clear to auscultation bilaterally. HEART: regular sinus rhythm, regular rate and rhythm. ABDOMEN: soft, non-tender/non-distended, bowel sounds present. FEMALE GENITOURINARY: normal external genitalia, labia - unremarkable, vagina - pink moist mucosa, no lesions or abnormal discharge, cervix - no discharge or lesions or CMT, adnexa - no masses or tenderness, uterus - nontender and normal size on palpation. PSYCH: affect normal, good eye contact.     Assessments     1. PMB (postmenopausal bleeding) - N95.0 (Primary)   2. Thickened endometrium - R93.89   Treatment  1. PMB (postmenopausal bleeding)  Start Provera Tablet, 10 MG, 1 tablet with food, Orally, Once a day, 30 day(s), 30, Refills 1 Notes:  Given [place] results completed on Sept 26th 2022, recommend hysteroscopy D/C with possible polypectomy.  Pt is advised she will be able to return home the same day if  she is doing well. Discussed risks of hysteroscopy including but not limited to infection, bleeding, possible perforation of the uterus, with the need for further surgery. Pt advised to avoid NSAIDs (Aspirin, Aleve, Advil, Ibuprofen, Motrin) from now until surgery given risk of bleeding during surgery. She may take Tylenol for pain management. She is advised to avoid eating or drinking starting midnight prior to surgery.  Referral To: Reason:Please schedule a hysteroscopy D/C possible polypectomy with myosure.     2. Thickened endometrium  Notes:  Given [place] results completed on Sept 26th 2022, recommend hysteroscopy D/C with possible polypectomy.  Pt is advised she will be able to return home the same day if she is doing well. Discussed risks of hysteroscopy including but not limited to  infection, bleeding, possible perforation of the uterus, with the need for further surgery. Pt advised to avoid NSAIDs (Aspirin, Aleve, Advil, Ibuprofen, Motrin) from now until surgery given risk of bleeding during surgery. She may take Tylenol for pain management. She is advised to avoid eating or drinking starting midnight prior to surgery.  Referral To: Reason:Please schedule a hysteroscopy D/C possible polypectomy with myosure.

## 2021-07-26 NOTE — H&P (Signed)
Reason for Appointment  1. Discuss u/s result/Hysteroscopy D/C    History of Present Illness  Isolation Precautions:  Has patient received COVID-19 vaccination? Yes- Pfizer , Yes- Moderna. Does patient report new onset of COVID symptoms? No. Has patient or close contact tested positive for COVID-19? No , not in the past 2 weeks.  General:  70 yo presents to discuss [place] results and discuss possible hysteroscopy D/C.  Pt was seen on 02/03/2021 for a consultation for evaluation of postmenopausal bleeding. She began having heavy bleeding that past weekend. she was seen at [facility]. A pelvic ultrasound was performed. . On ultrasound Pt's endometrial lining is 11.5 mm, but it should be less than 4 mm. Pt is informed of options for the evaluation of endometrium: EMB, D&C. Pt had EMB performed at this time. [place] was done on 01/31/2021. US shows 8.9 x 3.8 x 5.1 cm, endometrium is thickened at 11.51mm.  Pt was seen on 02/10/2021 and discussed how pathology revealed atrophic endometrium with abundant blood, no hyperplasia or malignancy was seen. Pt was prescribed Provera 10 mg daily for PMB.  Pt was seen 05/20/2021 and denied PMB for the last two months after she took provera. 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 This outbreak was severe and required her to take valtrex for 3 weeks.  Ultrasound Results Completed on September 26th 2022  Uterus: 6.6 cm X 3.3 cm X 4.5 cm  Endometrium: 8.1 mm  L Ovary: not seen  R Ovary: WNL Findings: UT fibroid- fundal- 1.7 cm X 1.3 cm X 1.9 cm. Endo appears thickened. No free fluid or adnexal masses seen.  TODAY:  Given [place] results completed on Sept 26th 2022, recommend hysteroscopy D/C with possible polypectomy.    Current Medications  Taking   Lidocaine 5 % Ointment 1 application as needed Externally Three times a day  valACYclovir HCl 1 GM Tablet 1 tablet Orally twice a day  valACYclovir HCl 500 MG  Tablet 1 tablet Orally twice a day for 5 days, starting at first sign of outbreak  Valtrex(valACYclovir HCl) 1 GM Tablet 1 tablet Orally Once a day  Losartan Potassium 100 MG Tablet 1 tablet Orally Once a day  hydroCHLOROthiazide 12.5 MG Tablet 1 tablet in the morning Orally Once a day  Flonase(Fluticasone Propionate) 50 MCG/ACT Suspension 1 spray in each nostril Nasally Once a day  ZyrTEC(Cetirizine HCl) 10 MG Tablet 1 tablet Orally Once a day  traMADol HCl 50 MG Tablet 1 tablet as needed Orally twice a day as needed, Notes: prn  Iron (Ferrous Sulfate) 325 (65 Fe) MG Tablet 1 tablet Orally Once a day  Montelukast Sodium 10 MG Tablet 1 tablet Orally Once a day , Notes: prn  Tylenol(APAP) 325 MG Tablet 2 tablets as needed Orally every 6 hrs, Notes: PRN  Discontinued   Losartan Potassium-HCTZ 100-12.5 MG Tablet TAKE 1 TABLET BY MOUTH EVERY DAY IN THE MORNING   Provera(medroxyPROGESTERone) 10 MG Tablet 1 tablet with food Orally Once a day  Medication List reviewed and reconciled with the patient    Past Medical History  HTN.    Allergies.    IBS.    Covid-19 2020.      Surgical History  Fallopian tubes and tumor/fibroids removed in her 20s   colonoscopy incomplete 2013  colonoscopy 08/2015    Family History  Father: deceased, unknown  Mother: deceased, CVA, diagnosed with Hypertension  Brother 1:  alive  Sister 1: alive, HTN, diagnosed with Hypertension, Breast cancer  Sister 2: alive, diagnosed with Hypertension  Sister 3: alive, diagnosed with Hypertension  1 brother(s) , 3 sister(s) .   neg gi family hx Sister recently passed.    Social History  General:  Tobacco use cigarettes: Never smoked, Tobacco history last updated 07/06/2021, Vaping No. no Alcohol. no Recreational drug use. Exercise: intermittent. Marital Status: married. Children: none. 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     Gyn History  Sexual activity not  currently sexually active.  Denies H/O Periods :.  LMP PMB, random spotting.  Birth control bilateral salpingectomy.  Last mammogram date 01/21/2021 - normal.  Denies H/O Abnormal pap smear.  STD HSV2.  GYN procedures bilateral salpingectomy, EMB.     OB History  Never been pregnant per patient.     Allergies  ACE Inhibitors: cough - Side Effects    Hospitalization/Major Diagnostic Procedure  none in the past yr 03/2021    Review of Systems  CONSTITUTIONAL:  Chills No. Fatigue No. Fever No. Night sweats No. Recent travel outside [place] No. Sweats No. Weight change No.  OPHTHALMOLOGY:  Blurring of vision no. Change in vision no. Double vision no.  ENT:  Dizziness no. Nose bleeds no. Sore throat no. Teeth pain no.  ALLERGY:  Hives no.  CARDIOLOGY:  Chest pain no. High blood pressure no. Irregular heart beat no. Leg edema no. Palpitations no.  RESPIRATORY:  Shortness of breath no. Cough no. Wheezing no.  UROLOGY:  Pain with urination no. Urinary urgency no. Urinary frequency no. Urinary incontinence no. Difficulty urinating No. Blood in urine No.  GASTROENTEROLOGY:  Abdominal pain no. Appetite change no. Bloating/belching no. Blood in stool or on toilet paper no. Change in bowel movements no. Constipation no. Diarrhea no. Difficulty swallowing no. Nausea no.  FEMALE REPRODUCTIVE:  Vulvar pain no. Vulvar rash no. Abnormal vaginal bleeding yes. Breast pain no. Nipple discharge no. Pain with intercourse no. Pelvic pain no. Unusual vaginal discharge no. Vaginal itching no.  MUSCULOSKELETAL:  Muscle aches no.  NEUROLOGY:  Headache no. Tingling/numbness no. Weakness no.  PSYCHOLOGY:  Depression no. Anxiety no. Nervousness no. Sleep disturbances no. Suicidal ideation no .  ENDOCRINOLOGY:  Excessive thirst no. Excessive urination no. Hair loss no. Heat or cold intolerance no.  HEMATOLOGY/LYMPH:  Abnormal bleeding no. Easy bruising no. Swollen glands no.  DERMATOLOGY:  New/changing  skin lesion no. Rash no. Sores no.     Vital Signs  Wt 227.8, Wt change -0.4 lbs, Ht 63, BMI 40.35, Pulse sitting 56, BP sitting 150/86.    Examination  General Examination: CONSTITUTIONAL: alert, oriented, NAD. SKIN: moist, warm. EYES: Conjunctiva clear. LUNGS: clear to auscultation bilaterally. HEART: regular sinus rhythm, regular rate and rhythm. ABDOMEN: soft, non-tender/non-distended, bowel sounds present. FEMALE GENITOURINARY: normal external genitalia, labia - unremarkable, vagina - pink moist mucosa, no lesions or abnormal discharge, cervix - no discharge or lesions or CMT, adnexa - no masses or tenderness, uterus - nontender and normal size on palpation. PSYCH: affect normal, good eye contact.       Assessments       1. PMB (postmenopausal bleeding) - N95.0 (Primary)    2. Thickened endometrium - R93.89    Treatment  1. PMB (postmenopausal bleeding)  Start Provera Tablet, 10 MG, 1 tablet with food, Orally, Once a day, 30 day(s), 30, Refills 1 Notes:  Given [place] results completed on Sept  26th 2022, recommend hysteroscopy D/C with possible polypectomy.  Pt is advised she will be able to return home the same day if she is doing well. Discussed risks of hysteroscopy including but not limited to infection, bleeding, possible perforation of the uterus, with the need for further surgery. Pt advised to avoid NSAIDs (Aspirin, Aleve, Advil, Ibuprofen, Motrin) from now until surgery given risk of bleeding during surgery. She may take Tylenol for pain management. She is advised to avoid eating or drinking starting midnight prior to surgery.  Referral To: Reason:Please schedule a hysteroscopy D/C possible polypectomy with myosure.      2. Thickened endometrium  Notes:  Given [place] results completed on Sept 26th 2022, recommend hysteroscopy D/C with possible polypectomy.  Pt is advised she will be able to return home the same day if she is doing well. Discussed risks of hysteroscopy including but  not limited to infection, bleeding, possible perforation of the uterus, with the need for further surgery. Pt advised to avoid NSAIDs (Aspirin, Aleve, Advil, Ibuprofen, Motrin) from now until surgery given risk of bleeding during surgery. She may take Tylenol for pain management. She is advised to avoid eating or drinking starting midnight prior to surgery.  Referral To: Reason:Please schedule a hysteroscopy D/C possible polypectomy with myosure.             Note Details  Gunnar Fusi, MD File Time 07/26/2021  2:17 PM  Author Type Physician Status Signed  Last Editor Gerald Leitz, MD Service Obstetrics/Gynecology

## 2021-07-26 NOTE — Progress Notes (Deleted)
  The note originally documented on this encounter has been moved the the encounter in which it belongs.  

## 2021-07-27 ENCOUNTER — Ambulatory Visit (HOSPITAL_BASED_OUTPATIENT_CLINIC_OR_DEPARTMENT_OTHER): Payer: Medicare PPO | Admitting: Anesthesiology

## 2021-07-27 ENCOUNTER — Ambulatory Visit (HOSPITAL_BASED_OUTPATIENT_CLINIC_OR_DEPARTMENT_OTHER)
Admission: RE | Admit: 2021-07-27 | Discharge: 2021-07-27 | Disposition: A | Discharge status: Home / Self Care | Payer: Medicare PPO | Attending: Obstetrics and Gynecology | Admitting: Obstetrics and Gynecology

## 2021-07-27 ENCOUNTER — Encounter (HOSPITAL_BASED_OUTPATIENT_CLINIC_OR_DEPARTMENT_OTHER): Payer: Self-pay | Admitting: Obstetrics and Gynecology

## 2021-07-27 ENCOUNTER — Other Ambulatory Visit: Payer: Self-pay

## 2021-07-27 ENCOUNTER — Encounter (HOSPITAL_BASED_OUTPATIENT_CLINIC_OR_DEPARTMENT_OTHER)
Admission: RE | Disposition: A | Discharge status: Home / Self Care | Payer: Self-pay | Source: Home / Self Care | Attending: Obstetrics and Gynecology

## 2021-07-27 DIAGNOSIS — R9389 Abnormal findings on diagnostic imaging of other specified body structures: Secondary | ICD-10-CM | POA: Diagnosis not present

## 2021-07-27 DIAGNOSIS — N840 Polyp of corpus uteri: Secondary | ICD-10-CM | POA: Insufficient documentation

## 2021-07-27 DIAGNOSIS — N950 Postmenopausal bleeding: Secondary | ICD-10-CM | POA: Insufficient documentation

## 2021-07-27 DIAGNOSIS — D509 Iron deficiency anemia, unspecified: Secondary | ICD-10-CM | POA: Diagnosis not present

## 2021-07-27 DIAGNOSIS — I10 Essential (primary) hypertension: Secondary | ICD-10-CM | POA: Diagnosis not present

## 2021-07-27 HISTORY — DX: Iron deficiency anemia, unspecified: D50.9

## 2021-07-27 HISTORY — DX: Presence of spectacles and contact lenses: Z97.3

## 2021-07-27 HISTORY — DX: Other intervertebral disc degeneration, lumbar region: M51.36

## 2021-07-27 HISTORY — PX: DILATATION & CURETTAGE/HYSTEROSCOPY WITH MYOSURE: SHX6511

## 2021-07-27 HISTORY — DX: Allergic rhinitis, unspecified: J30.9

## 2021-07-27 HISTORY — DX: Unspecified osteoarthritis, unspecified site: M19.90

## 2021-07-27 HISTORY — DX: Postmenopausal bleeding: N95.0

## 2021-07-27 HISTORY — DX: Anemia, unspecified: D64.9

## 2021-07-27 HISTORY — DX: Other intervertebral disc degeneration, lumbar region without mention of lumbar back pain or lower extremity pain: M51.369

## 2021-07-27 HISTORY — DX: Sciatica, left side: M54.32

## 2021-07-27 HISTORY — DX: Gastro-esophageal reflux disease without esophagitis: K21.9

## 2021-07-27 HISTORY — DX: Abnormal findings on diagnostic imaging of other specified body structures: R93.89

## 2021-07-27 LAB — POCT I-STAT, CHEM 8
BUN: 16 mg/dL (ref 8–23)
Calcium, Ion: 1.25 mmol/L (ref 1.15–1.40)
Chloride: 104 mmol/L (ref 98–111)
Creatinine, Ser: 0.7 mg/dL (ref 0.44–1.00)
Glucose, Bld: 127 mg/dL — ABNORMAL HIGH (ref 70–99)
HCT: 35 % — ABNORMAL LOW (ref 36.0–46.0)
Hemoglobin: 11.9 g/dL — ABNORMAL LOW (ref 12.0–15.0)
Potassium: 4.1 mmol/L (ref 3.5–5.1)
Sodium: 141 mmol/L (ref 135–145)
TCO2: 26 mmol/L (ref 22–32)

## 2021-07-27 SURGERY — DILATATION & CURETTAGE/HYSTEROSCOPY WITH MYOSURE
Anesthesia: General

## 2021-07-27 MED ORDER — SODIUM CHLORIDE 0.9 % IR SOLN
Status: DC | PRN
  Administered 2021-07-27: 3000 mL

## 2021-07-27 MED ORDER — ACETAMINOPHEN 500 MG PO TABS
1000.0000 mg | ORAL_TABLET | Freq: Once | ORAL | Status: AC
  Administered 2021-07-27: 1000 mg via ORAL

## 2021-07-27 MED ORDER — ONDANSETRON HCL 4 MG/2ML IJ SOLN: 4.0000 mg | Freq: Once | INTRAMUSCULAR | Status: DC | PRN

## 2021-07-27 MED ORDER — AMISULPRIDE (ANTIEMETIC) 5 MG/2ML IV SOLN: 10.0000 mg | Freq: Once | INTRAVENOUS | Status: DC | PRN

## 2021-07-27 MED ORDER — POVIDONE-IODINE 10 % EX SWAB: 2.0000 "application " | Freq: Once | CUTANEOUS | Status: DC

## 2021-07-27 MED ORDER — LIDOCAINE HCL (CARDIAC) PF 100 MG/5ML IV SOSY
PREFILLED_SYRINGE | INTRAVENOUS | Status: DC | PRN
  Administered 2021-07-27: 100 mg via INTRAVENOUS

## 2021-07-27 MED ORDER — PHENYLEPHRINE HCL (PRESSORS) 10 MG/ML IV SOLN
INTRAVENOUS | Status: DC | PRN
  Administered 2021-07-27 (×4): 40 ug via INTRAVENOUS

## 2021-07-27 MED ORDER — ACETAMINOPHEN 500 MG PO TABS: 1000.0000 mg | ORAL_TABLET | Freq: Three times a day (TID) | ORAL | 0 refills | Status: DC | PRN

## 2021-07-27 MED ORDER — PROPOFOL 10 MG/ML IV BOLUS
INTRAVENOUS | Status: AC
  Filled 2021-07-27: qty 20

## 2021-07-27 MED ORDER — PROPOFOL 10 MG/ML IV BOLUS
INTRAVENOUS | Status: DC | PRN
  Administered 2021-07-27 (×2): 50 mg via INTRAVENOUS
  Administered 2021-07-27: 150 mg via INTRAVENOUS

## 2021-07-27 MED ORDER — LACTATED RINGERS IV SOLN: INTRAVENOUS | Status: DC

## 2021-07-27 MED ORDER — ONDANSETRON HCL 4 MG/2ML IJ SOLN
INTRAMUSCULAR | Status: DC | PRN
  Administered 2021-07-27: 4 mg via INTRAVENOUS

## 2021-07-27 MED ORDER — FENTANYL CITRATE (PF) 100 MCG/2ML IJ SOLN: 25.0000 ug | INTRAMUSCULAR | Status: DC | PRN

## 2021-07-27 MED ORDER — FENTANYL CITRATE (PF) 100 MCG/2ML IJ SOLN
INTRAMUSCULAR | Status: DC | PRN
  Administered 2021-07-27: 25 ug via INTRAVENOUS
  Administered 2021-07-27: 50 ug via INTRAVENOUS

## 2021-07-27 MED ORDER — FENTANYL CITRATE (PF) 100 MCG/2ML IJ SOLN
INTRAMUSCULAR | Status: AC
  Filled 2021-07-27: qty 2

## 2021-07-27 MED ORDER — IBUPROFEN 800 MG PO TABS: 800.0000 mg | ORAL_TABLET | Freq: Three times a day (TID) | ORAL | 0 refills | Status: AC | PRN

## 2021-07-27 MED ORDER — DEXAMETHASONE SODIUM PHOSPHATE 10 MG/ML IJ SOLN
INTRAMUSCULAR | Status: AC
  Filled 2021-07-27: qty 1

## 2021-07-27 MED ORDER — BUPIVACAINE HCL (PF) 0.25 % IJ SOLN
INTRAMUSCULAR | Status: DC | PRN
  Administered 2021-07-27: 20 mL

## 2021-07-27 MED ORDER — ONDANSETRON HCL 4 MG/2ML IJ SOLN
INTRAMUSCULAR | Status: AC
  Filled 2021-07-27: qty 2

## 2021-07-27 MED ORDER — ACETAMINOPHEN 500 MG PO TABS
ORAL_TABLET | ORAL | Status: AC
  Filled 2021-07-27: qty 2

## 2021-07-27 MED ORDER — OXYCODONE HCL 5 MG PO TABS: 5.0000 mg | ORAL_TABLET | Freq: Once | ORAL | Status: DC | PRN

## 2021-07-27 MED ORDER — KETOROLAC TROMETHAMINE 30 MG/ML IJ SOLN
INTRAMUSCULAR | Status: AC
  Filled 2021-07-27: qty 1

## 2021-07-27 MED ORDER — OXYCODONE HCL 5 MG/5ML PO SOLN: 5.0000 mg | Freq: Once | ORAL | Status: DC | PRN

## 2021-07-27 MED ORDER — DEXAMETHASONE SODIUM PHOSPHATE 4 MG/ML IJ SOLN
INTRAMUSCULAR | Status: DC | PRN
  Administered 2021-07-27: 10 mg via INTRAVENOUS

## 2021-07-27 MED ORDER — LIDOCAINE 2% (20 MG/ML) 5 ML SYRINGE
INTRAMUSCULAR | Status: AC
  Filled 2021-07-27: qty 5

## 2021-07-27 SURGICAL SUPPLY — 11 items
CATH ROBINSON RED A/P 16FR (CATHETERS) ×2 IMPLANT
DEVICE MYOSURE REACH (MISCELLANEOUS) ×3 IMPLANT
GAUZE 4X4 16PLY ~~LOC~~+RFID DBL (SPONGE) ×3 IMPLANT
GLOVE SURG ENC TEXT LTX SZ6.5 (GLOVE) ×3 IMPLANT
GLOVE SURG UNDER POLY LF SZ6.5 (GLOVE) ×6 IMPLANT
GOWN STRL REUS W/TWL LRG LVL3 (GOWN DISPOSABLE) ×6 IMPLANT
KIT PROCEDURE FLUENT (KITS) ×3 IMPLANT
KIT TURNOVER CYSTO (KITS) ×3 IMPLANT
PACK VAGINAL MINOR WOMEN LF (CUSTOM PROCEDURE TRAY) ×3 IMPLANT
PAD OB MATERNITY 4.3X12.25 (PERSONAL CARE ITEMS) ×3 IMPLANT
SEAL ROD LENS SCOPE MYOSURE (ABLATOR) ×3 IMPLANT

## 2021-07-27 NOTE — Anesthesia Postprocedure Evaluation (Signed)
Anesthesia Post Note  Patient: Tiffany Scott  Procedure(s) Performed: DILATATION & CURETTAGE/HYSTEROSCOPY WITH MYOSURE     Patient location during evaluation: PACU Anesthesia Type: General Level of consciousness: awake and alert Pain management: pain level controlled Vital Signs Assessment: post-procedure vital signs reviewed and stable Respiratory status: spontaneous breathing, nonlabored ventilation and respiratory function stable Cardiovascular status: blood pressure returned to baseline and stable Postop Assessment: no apparent nausea or vomiting Anesthetic complications: no   No notable events documented.  Last Vitals:  Vitals:   07/27/21 1115 07/27/21 1148  BP: 125/73 (!) 144/74  Pulse: (!) 59 (!) 53  Resp: 16 14  Temp:  (!) 36.2 C  SpO2: 100% 100%    Last Pain:  Vitals:   07/27/21 1148  TempSrc: Oral  PainSc: 0-No pain                 Lucretia Kern

## 2021-07-27 NOTE — H&P (Signed)
Date of Initial H&P: 07/26/2021  History reviewed, patient examined, no change in status, stable for surgery.  

## 2021-07-27 NOTE — Discharge Instructions (Signed)
DISCHARGE INSTRUCTIONS: D&C / D&E The following instructions have been prepared to help you care for yourself upon your return home.   Personal hygiene:  Use sanitary pads for vaginal drainage, not tampons.  Shower the day after your procedure.  NO tub baths, pools or Jacuzzis for 2-3 weeks.  Wipe front to back after using the bathroom.  Activity and limitations:  Do NOT drive or operate any equipment for 24 hours. The effects of anesthesia are still present and drowsiness may result.  Do NOT rest in bed all day.  Walking is encouraged.  Walk up and down stairs slowly.  You may resume your normal activity in one to two days or as indicated by your physician.  Sexual activity: NO intercourse for at least 2 weeks after the procedure, or as indicated by your physician.  Diet: Eat a light meal as desired this evening. You may resume your usual diet tomorrow.  Return to work: You may resume your work activities in one to two days or as indicated by your doctor.  What to expect after your surgery: Expect to have vaginal bleeding/discharge for 2-3 days and spotting for up to 10 days. It is not unusual to have soreness for up to 1-2 weeks. You may have a slight burning sensation when you urinate for the first day. Mild cramps may continue for a couple of days. You may have a regular period in 2-6 weeks.  Call your doctor for any of the following:  Excessive vaginal bleeding, saturating and changing one pad every hour.  Inability to urinate 6 hours after discharge from hospital.  Pain not relieved by pain medication.  Fever of 100.4 F or greater.  Unusual vaginal discharge or odor.   Post Anesthesia Home Care Instructions  Activity: Get plenty of rest for the remainder of the day. A responsible individual must stay with you for 24 hours following the procedure.  For the next 24 hours, DO NOT: -Drive a car -Advertising copywriter -Drink alcoholic beverages -Take any medication unless  instructed by your physician -Make any legal decisions or sign important papers.  Meals: Start with liquid foods such as gelatin or soup. Progress to regular foods as tolerated. Avoid greasy, spicy, heavy foods. If nausea and/or vomiting occur, drink only clear liquids until the nausea and/or vomiting subsides. Call your physician if vomiting continues.  Special Instructions/Symptoms: Your throat may feel dry or sore from the anesthesia or the breathing tube placed in your throat during surgery. If this causes discomfort, gargle with warm salt water. The discomfort should disappear within 24 hours.  No acetaminophen/Tylenol until after 3:00 pm today if needed.

## 2021-07-27 NOTE — Transfer of Care (Signed)
Immediate Anesthesia Transfer of Care Note  Patient: Tiffany Scott  Procedure(s) Performed: DILATATION & CURETTAGE/HYSTEROSCOPY WITH MYOSURE  Patient Location: PACU  Anesthesia Type:General  Level of Consciousness: awake, alert , oriented and patient cooperative  Airway & Oxygen Therapy: Patient Spontanous Breathing and Patient connected to nasal cannula oxygen  Post-op Assessment: Report given to RN and Post -op Vital signs reviewed and stable  Post vital signs: Reviewed and stable  Last Vitals:  Vitals Value Taken Time  BP 132/92 07/27/21 1100  Temp    Pulse 59 07/27/21 1102  Resp 12 07/27/21 1102  SpO2 100 % 07/27/21 1102  Vitals shown include unvalidated device data.  Last Pain:  Vitals:   07/27/21 0901  TempSrc: Oral  PainSc: 0-No pain      Patients Stated Pain Goal: 5 (07/27/21 0901)  Complications: No notable events documented.

## 2021-07-27 NOTE — Anesthesia Preprocedure Evaluation (Signed)
Anesthesia Evaluation  Patient identified by MRN, date of birth, ID band Patient awake    Reviewed: Allergy & Precautions, NPO status , Patient's Chart, lab work & pertinent test results  History of Anesthesia Complications Negative for: history of anesthetic complications  Airway Mallampati: II  TM Distance: >3 FB Neck ROM: Full    Dental  (+) Dental Advisory Given, Teeth Intact   Pulmonary neg pulmonary ROS,    Pulmonary exam normal        Cardiovascular hypertension, Pt. on medications Normal cardiovascular exam     Neuro/Psych negative neurological ROS     GI/Hepatic negative GI ROS, Neg liver ROS,   Endo/Other  negative endocrine ROS  Renal/GU negative Renal ROS  negative genitourinary   Musculoskeletal  (+) Arthritis ,   Abdominal   Peds  Hematology  (+) anemia ,   Anesthesia Other Findings   Reproductive/Obstetrics ENDOMETRIAL THICKENING  POSTMENOPAUSAL BLEEDING                           Anesthesia Physical Anesthesia Plan  ASA: 2  Anesthesia Plan: General   Post-op Pain Management:    Induction: Intravenous  PONV Risk Score and Plan: 3 and Ondansetron, Dexamethasone, Midazolam and Treatment may vary due to age or medical condition  Airway Management Planned: LMA  Additional Equipment: None  Intra-op Plan:   Post-operative Plan: Extubation in OR  Informed Consent: I have reviewed the patients History and Physical, chart, labs and discussed the procedure including the risks, benefits and alternatives for the proposed anesthesia with the patient or authorized representative who has indicated his/her understanding and acceptance.     Dental advisory given  Plan Discussed with:   Anesthesia Plan Comments:         Anesthesia Quick Evaluation

## 2021-07-27 NOTE — Anesthesia Procedure Notes (Addendum)
Procedure Name: LMA Insertion Date/Time: 07/27/2021 10:22 AM Performed by: Jessica Priest, CRNA Pre-anesthesia Checklist: Patient identified, Emergency Drugs available, Suction available, Patient being monitored and Timeout performed Patient Re-evaluated:Patient Re-evaluated prior to induction Oxygen Delivery Method: Circle system utilized Preoxygenation: Pre-oxygenation with 100% oxygen Induction Type: IV induction Ventilation: Mask ventilation without difficulty LMA: LMA inserted LMA Size: 4.0 Number of attempts: 1 Airway Equipment and Method: Bite block Placement Confirmation: positive ETCO2, breath sounds checked- equal and bilateral and CO2 detector Tube secured with: Tape Dental Injury: Teeth and Oropharynx as per pre-operative assessment

## 2021-07-27 NOTE — Op Note (Signed)
07/27/2021  11:03 AM  PATIENT:  Tiffany Scott  70 y.o. female  PRE-OPERATIVE DIAGNOSIS:  ENDOMETRIAL THICKENING POSTMENOPAUSAL BLEEDING  POST-OPERATIVE DIAGNOSIS:  ENDOMETRIAL THICKENING POSTMENOPAUSAL BLEEDING  PROCEDURE:  Procedure(s): DILATATION & CURETTAGE/HYSTEROSCOPY WITH MYOSURE (N/A)  SURGEON:  Surgeon(s) and Role:    Gerald Leitz, MD - Primary  PHYSICIAN ASSISTANT:   ASSISTANTS: none   ANESTHESIA:   general  EBL:  5 mL   BLOOD ADMINISTERED:none  DRAINS: none   LOCAL MEDICATIONS USED:  MARCAINE     SPECIMEN:  Source of Specimen:  endometrial curetting's and endometrial polyp   DISPOSITION OF SPECIMEN:  PATHOLOGY  COUNTS:  YES  TOURNIQUET:  * No tourniquets in log *  DICTATION: .Note written in EPIC  PLAN OF CARE: Discharge to home after PACU  PATIENT DISPOSITION:  PACU - hemodynamically stable.   Delay start of Pharmacological VTE agent (>24 hrs) due to surgical blood loss or risk of bleeding: not applicable  Findings: normal external genitalia, normal vaginal mucosa, normal cervix. 1 cm polyp at the right fundal aspect of the uterus. Proliferative appearing endometrium.   Procedure: Patient was taken to the operating room #5 at [facility] where she was placed under general anesthesia. She was placed in the dorsal lithotomy position. She was prepped and draped in the usual sterile fashion. A speculum was placed into the vaginal vault. The anterior lip of the cervix was grasped with a single-tooth tenaculum. Quarter percent Marcaine was injected at the 4 and 8:00 positions of the cervix. The cervix was then sounded to 6 cm. The cervix was dilated to approximately 6 mm. Mysosure operative  hysteroscope was inserted. The findings noted above. Myosure reach  blade was introduced through the hysteroscope. The endometrial mass  and curetting were collected  in less than 5 minutes.  There was no evidence of perforation. Hysteroscope was then removed.  The  single-tooth tenaculum was removed from the anterior lip of the cervix. Excellent hemostasis was noted. The speculum was removed from the patient's vagina. She was awakened from anesthesia taken care  To the recovery  room awake and in stable condition. Sponge lap and needle counts were correct x 2.

## 2021-07-28 ENCOUNTER — Encounter (HOSPITAL_BASED_OUTPATIENT_CLINIC_OR_DEPARTMENT_OTHER): Payer: Self-pay | Admitting: Anesthesiology

## 2021-07-28 LAB — SURGICAL PATHOLOGY

## 2021-07-28 NOTE — Addendum Note (Signed)
Addendum  created 07/28/21 0848 by Jessica Priest, CRNA   Charge Capture section accepted

## 2021-07-28 NOTE — Addendum Note (Signed)
Addendum  created 07/28/21 1017 by Bishop Limbo, CRNA   Charge Capture section accepted

## 2021-08-02 DIAGNOSIS — J309 Allergic rhinitis, unspecified: Secondary | ICD-10-CM | POA: Diagnosis not present

## 2021-08-02 DIAGNOSIS — M79602 Pain in left arm: Secondary | ICD-10-CM | POA: Diagnosis not present

## 2021-08-11 DIAGNOSIS — N898 Other specified noninflammatory disorders of vagina: Secondary | ICD-10-CM | POA: Diagnosis not present

## 2021-08-19 DIAGNOSIS — H26491 Other secondary cataract, right eye: Secondary | ICD-10-CM | POA: Diagnosis not present

## 2021-08-19 DIAGNOSIS — H43811 Vitreous degeneration, right eye: Secondary | ICD-10-CM | POA: Diagnosis not present

## 2021-08-19 DIAGNOSIS — Z961 Presence of intraocular lens: Secondary | ICD-10-CM | POA: Diagnosis not present

## 2021-08-19 DIAGNOSIS — H04123 Dry eye syndrome of bilateral lacrimal glands: Secondary | ICD-10-CM | POA: Diagnosis not present

## 2021-08-25 DIAGNOSIS — H26492 Other secondary cataract, left eye: Secondary | ICD-10-CM | POA: Diagnosis not present

## 2021-09-01 DIAGNOSIS — H04123 Dry eye syndrome of bilateral lacrimal glands: Secondary | ICD-10-CM | POA: Diagnosis not present

## 2021-09-01 DIAGNOSIS — Z961 Presence of intraocular lens: Secondary | ICD-10-CM | POA: Diagnosis not present

## 2021-09-01 DIAGNOSIS — H43811 Vitreous degeneration, right eye: Secondary | ICD-10-CM | POA: Diagnosis not present

## 2021-09-01 DIAGNOSIS — R202 Paresthesia of skin: Secondary | ICD-10-CM | POA: Diagnosis not present

## 2021-09-01 DIAGNOSIS — H5211 Myopia, right eye: Secondary | ICD-10-CM | POA: Diagnosis not present

## 2021-09-01 DIAGNOSIS — I10 Essential (primary) hypertension: Secondary | ICD-10-CM | POA: Diagnosis not present

## 2021-09-02 ENCOUNTER — Other Ambulatory Visit: Payer: Self-pay

## 2021-09-02 ENCOUNTER — Encounter: Payer: Self-pay | Admitting: Neurology

## 2021-09-02 DIAGNOSIS — R202 Paresthesia of skin: Secondary | ICD-10-CM

## 2021-09-15 DIAGNOSIS — I10 Essential (primary) hypertension: Secondary | ICD-10-CM | POA: Diagnosis not present

## 2021-09-15 DIAGNOSIS — R42 Dizziness and giddiness: Secondary | ICD-10-CM | POA: Diagnosis not present

## 2021-09-15 DIAGNOSIS — D649 Anemia, unspecified: Secondary | ICD-10-CM | POA: Diagnosis not present

## 2021-09-20 DIAGNOSIS — D649 Anemia, unspecified: Secondary | ICD-10-CM | POA: Diagnosis not present

## 2021-10-05 ENCOUNTER — Other Ambulatory Visit: Payer: Self-pay

## 2021-10-05 ENCOUNTER — Ambulatory Visit: Payer: Medicare PPO | Admitting: Neurology

## 2021-10-05 DIAGNOSIS — R202 Paresthesia of skin: Secondary | ICD-10-CM | POA: Diagnosis not present

## 2021-10-05 DIAGNOSIS — G5602 Carpal tunnel syndrome, left upper limb: Secondary | ICD-10-CM

## 2021-10-05 NOTE — Procedures (Signed)
[facility]  [address] [place], [address]  [place], [place] [address] Tel: [phone] Fax:  [phone] Test Date:  10/05/2021  Patient: Tiffany Scott DOB: October 30, 1950 Physician: Nita Sickle, DO  Sex: Female Height: 5\' 4"  Ref Phys: , [facility]  ID#: [id number]   Technician:    Patient Complaints: This is a 71 year old female referred for evaluation of right hand paresthesias.  NCV & EMG Findings: Extensive electrodiagnostic testing of the left upper extremity shows:  Left median sensory response shows prolonged latency (4.0 ms).  Left ulnar sensory responses within normal limits.  Left median and ulnar motor responses are within normal limits.  There is no evidence of active or chronic motor axonal loss changes affecting any of the tested muscles.  Motor unit configuration and recruitment pattern is within normal limits.    Impression: Left median neuropathy at or distal to the wrist, consistent with a clinical diagnosis of carpal tunnel syndrome.  Overall, these findings are mild in degree electrically.   ___________________________ 61, DO    Nerve Conduction Studies Anti Sensory Summary Table   Stim Site NR Peak (ms) Norm Peak (ms) P-T Amp (V) Norm P-T Amp  Left Median Anti Sensory (2nd Digit)  32C  Wrist    4.0 <3.8 12.8 >10  Left Ulnar Anti Sensory (5th Digit)  32C  Wrist    2.9 <3.2 16.5 >5   Motor Summary Table   Stim Site NR Onset (ms) Norm Onset (ms) O-P Amp (mV) Norm O-P Amp Site1 Site2 Delta-0 (ms) Dist (cm) Vel (m/s) Norm Vel (m/s)  Left Median Motor (Abd Poll Brev)  32C  Wrist    3.4 <4.0 9.7 >5 Elbow Wrist 4.3 26.0 60 >50  Elbow    7.7  9.2         Left Ulnar Motor (Abd Dig Minimi)  32C  Wrist    1.5 <3.1 7.5 >7 B Elbow Wrist 3.8 21.0 55 >50  B Elbow    5.3  7.3  A Elbow B Elbow 1.6 10.0 62 >50  A Elbow    6.9  7.0          EMG   Side Muscle Ins Act Fibs Psw Fasc Number Recrt Dur Dur. Amp Amp. Poly Poly. Comment  Left  1stDorInt Nml Nml Nml Nml Nml Nml Nml Nml Nml Nml Nml Nml N/A  Left Abd Poll Brev Nml Nml Nml Nml Nml Nml Nml Nml Nml Nml Nml Nml N/A  Left PronatorTeres Nml Nml Nml Nml Nml Nml Nml Nml Nml Nml Nml Nml N/A  Left Biceps Nml Nml Nml Nml Nml Nml Nml Nml Nml Nml Nml Nml N/A  Left Triceps Nml Nml Nml Nml Nml Nml Nml Nml Nml Nml Nml Nml N/A  Left Deltoid Nml Nml Nml Nml Nml Nml Nml Nml Nml Nml Nml Nml N/A      Waveforms:

## 2021-10-29 DIAGNOSIS — G5600 Carpal tunnel syndrome, unspecified upper limb: Secondary | ICD-10-CM | POA: Diagnosis not present

## 2021-11-10 DIAGNOSIS — G5600 Carpal tunnel syndrome, unspecified upper limb: Secondary | ICD-10-CM | POA: Diagnosis not present

## 2021-11-12 ENCOUNTER — Encounter: Payer: Self-pay | Admitting: Orthopaedic Surgery

## 2021-11-12 ENCOUNTER — Ambulatory Visit: Payer: Self-pay

## 2021-11-12 ENCOUNTER — Other Ambulatory Visit: Payer: Self-pay

## 2021-11-12 ENCOUNTER — Ambulatory Visit (INDEPENDENT_AMBULATORY_CARE_PROVIDER_SITE_OTHER): Payer: Medicare PPO

## 2021-11-12 ENCOUNTER — Ambulatory Visit: Payer: Medicare PPO | Admitting: Orthopaedic Surgery

## 2021-11-12 VITALS — BP 126/74 | HR 79 | Ht 64.0 in | Wt 220.0 lb

## 2021-11-12 DIAGNOSIS — M25562 Pain in left knee: Secondary | ICD-10-CM

## 2021-11-12 DIAGNOSIS — M545 Low back pain, unspecified: Secondary | ICD-10-CM

## 2021-11-12 DIAGNOSIS — M25561 Pain in right knee: Secondary | ICD-10-CM | POA: Diagnosis not present

## 2021-11-12 DIAGNOSIS — M170 Bilateral primary osteoarthritis of knee: Secondary | ICD-10-CM

## 2021-11-12 DIAGNOSIS — G8929 Other chronic pain: Secondary | ICD-10-CM

## 2021-11-12 NOTE — Progress Notes (Signed)
? ?Office Visit Note ?  ?Patient: Tiffany Scott           ?Date of Birth: 1951/05/03           ?MRN: [id number] ?Visit Date: 11/12/2021 ?             ?Requested by: Maurice Small, MD ?[address] ?[address] ?[place],  [place] [address] ?PCP: Maurice Small, MD ? ? ?Assessment & Plan: ?Visit Diagnoses:  ?1. Chronic bilateral low back pain, unspecified whether sciatica present   ?2. Chronic pain of both knees   ?3. Bilateral primary osteoarthritis of knee   ? ? ?Plan: Patient has a knee osteoarthritis as well as some lumbar disc degeneration.Right knee injection performed with good relief of right knee pain.  We can follow her up in 1 to 2 monthsTo check her progress.  She was walking better post right knee injection. ? ?Follow-Up Instructions: No follow-ups on file.  ? ?Orders:  ?Orders Placed This Encounter  ?Procedures  ? Large Joint Inj  ? XR Lumbar Spine Complete  ? XR Knee 1-2 Views Right  ? XR Knee 1-2 Views Left  ? ?No orders of the defined types were placed in this encounter. ? ? ? ? Procedures: ?Large Joint Inj: R knee on 11/15/2021 5:48 PM ?Indications: pain and joint swelling ?Details: 22 G 1.5 in needle, anterolateral approach ? ?Arthrogram: No ? ?Medications: 40 mg methylPREDNISolone acetate 40 MG/ML; 0.5 mL lidocaine 1 %; 4 mL bupivacaine 0.25 % ?Outcome: tolerated well, no immediate complications ?Procedure, treatment alternatives, risks and benefits explained, specific risks discussed. Consent was given by the patient. Immediately prior to procedure a time out was called to verify the correct patient, procedure, equipment, support staff and site/side marked as required. Patient was prepped and draped in the usual sterile fashion.  ? ? ? ? ?Clinical Data: ?No additional findings. ? ? ?Subjective: ?Chief Complaint  ?Patient presents with  ? Lower Back - Pain  ? ? ?HPI 71 year old female here scheduled for a second opinion.  She was seen at [facility] was having injections and states  she did multiple injections and states she is "tired of these shots.  She has had back pain pain radiates down her legs has pain in her knees worse on the right than left.  She states injections gave her relief for 1 or 2 weeks and then recurred.  She is use some diclofenac gel with slight relief.  She has significant problems getting from sitting standing problems going up and down stairs.  She notes creaking grinding and popping in her knees worse on the right than left knee.  Lumbar MRI 1 year ago showed some paracentral protrusion at L2-3, mild central narrowing at L3-4, moderate central stenosis at L4-5 with disc bulge.  Facet degenerative changes at L5-S1 with 3 mm anterolisthesis but no stenosis at L5-S1. ? ?Review of Systems all the systems noncontributory to HPI.  No chills or fever no associated bowel or bladder symptoms. ? ? ?Objective: ?Vital Signs: BP 126/74   Pulse 79   Ht 5\' 4"  (1.626 m)   Wt 220 lb (99.8 kg)   BMI 37.76 kg/m?  ? ?Physical Exam ? ?Ortho Exam ? ?Specialty Comments:  ?No specialty comments available. ? ?Imaging: ?Narrative & Impression  ?CLINICAL DATA:  Chronic low back pain radiating into the left hip ?and buttock, worse over the past few months. ?  ?EXAM: ?MRI LUMBAR SPINE WITHOUT CONTRAST ?  ?TECHNIQUE: ?Multiplanar, multisequence MR  imaging of the lumbar spine was ?performed. No intravenous contrast was administered. ?  ?COMPARISON:  Plain films lumbar spine 05/27/2020. ?  ?FINDINGS: ?Segmentation:  Standard. ?  ?Alignment: Mild convex left scoliosis with the apex at L4-5. Trace ?retrolisthesis L1 on L2 and L2 on L3. 0.3 cm anterolisthesis L5 on ?S1. ?  ?Vertebrae: No fracture, evidence of discitis, or bone lesion. ?Degenerative endplate signal change is most notable anteriorly at ?L2-3. ?  ?Conus medullaris and cauda equina: Conus extends to the L1 level. ?Conus and cauda equina appear normal. ?  ?Paraspinal and other soft tissues: Negative. ?  ?Disc levels: ?  ?T10-11 and  T11-12 are imaged in the sagittal plane only. There is a ?minimal disc bulge at T10-11. No central canal or foraminal stenosis ?at either level. ?  ?T12-L1: Negative. ?  ?L1-2: There is a shallow disc bulge without central canal or ?foraminal stenosis. ?  ?L2-3: A left paracentral protrusion indents the ventral thecal sac ?and deflects the left L3 root. The root is not compressed. Mild ?bilateral foraminal narrowing is present. ?  ?L3-4: Shallow disc bulge more prominent to the right. There is some ?ligamentum flavum thickening. Mild central canal stenosis is seen. ?The left foramen is widely patent. Mild right foraminal narrowing ?noted. ?  ?L4-5: Moderate facet arthropathy and ligamentum flavum thickening. ?The patient has a disc bulge with a superimposed right subarticular ?recess protrusion. Moderate central canal stenosis and narrowing in ?the right subarticular recess with impingement on the right L5 root. ?Mild to moderate foraminal narrowing is worse on the right. ?  ?L5-S1: Advanced bilateral facet degenerative change. The disc is ?uncovered without bulging. No stenosis. ?  ?IMPRESSION: ?A left paracentral protrusion at L2-3 deflects the left L3 root ?without compressing it. There is mild bilateral foraminal narrowing ?at L2-3. ?  ?Mild central canal and right foraminal narrowing L3-4. ?  ?Moderate central canal stenosis at L4-5 where there is a disc bulge ?and right subarticular recess protrusion. The protrusion impinges on ?the right L5 root. Mild to moderate foraminal narrowing is worse on ?the right. ?  ?Advanced bilateral facet degenerative disease at L5-S1 results in ?0.3 cm anterolisthesis. No stenosis at L5-S1. ?  ?  ?Electronically Signed ?  By: Drusilla Kanner M.D. ?  On: 12/02/2020 10:14  ? ? ? ? ?PMFS History: ?Patient Active Problem List  ? Diagnosis Date Noted  ? Bilateral primary osteoarthritis of knee 11/15/2021  ? Postmenopausal bleeding 07/27/2021  ? ?Past Medical History:  ?Diagnosis  Date  ? Allergic rhinitis   ? DDD (degenerative disc disease), lumbar   ? History of COVID-19 2020  ? per pt moderate symptoms that resolved  ? Hypertension   ? followed by pcp  ? IDA (iron deficiency anemia)   ? OA (osteoarthritis)   ? knees  ? PMB (postmenopausal bleeding)   ? Sciatica, left side   ? Thickened endometrium   ? Wears glasses   ?  ?Family History  ?Problem Relation Age of Onset  ? Breast cancer Sister 39  ?  ?Past Surgical History:  ?Procedure Laterality Date  ? CATARACT EXTRACTION W/ INTRAOCULAR LENS IMPLANT Bilateral 2019  ? COLONOSCOPY  2015  ? DILATATION & CURETTAGE/HYSTEROSCOPY WITH MYOSURE N/A 07/27/2021  ? Procedure: DILATATION & CURETTAGE/HYSTEROSCOPY WITH MYOSURE;  Surgeon: Gerald Leitz, MD;  Location: [facility];  Service: Gynecology;  Laterality: N/A;  ? TUBAL LIGATION Bilateral   ? age 43s  ? ?Social History  ? ?Occupational History  ?  Not on file  ?Tobacco Use  ? Smoking status: Never  ? Smokeless tobacco: Never  ?Vaping Use  ? Vaping Use: Never used  ?Substance and Sexual Activity  ? Alcohol use: No  ? Drug use: Never  ? Sexual activity: Not on file  ? ? ? ? ? ? ?

## 2021-11-15 ENCOUNTER — Ambulatory Visit: Payer: Medicare PPO | Admitting: Neurology

## 2021-11-15 DIAGNOSIS — M170 Bilateral primary osteoarthritis of knee: Secondary | ICD-10-CM | POA: Diagnosis not present

## 2021-11-15 DIAGNOSIS — M25561 Pain in right knee: Secondary | ICD-10-CM | POA: Diagnosis not present

## 2021-11-15 DIAGNOSIS — G8929 Other chronic pain: Secondary | ICD-10-CM | POA: Diagnosis not present

## 2021-11-15 DIAGNOSIS — M545 Low back pain, unspecified: Secondary | ICD-10-CM | POA: Diagnosis not present

## 2021-11-15 DIAGNOSIS — M25562 Pain in left knee: Secondary | ICD-10-CM | POA: Diagnosis not present

## 2021-11-15 MED ORDER — BUPIVACAINE HCL 0.25 % IJ SOLN
4.0000 mL | INTRAMUSCULAR | Status: AC | PRN
  Administered 2021-11-15: 4 mL via INTRA_ARTICULAR

## 2021-11-15 MED ORDER — METHYLPREDNISOLONE ACETATE 40 MG/ML IJ SUSP
40.0000 mg | INTRAMUSCULAR | Status: AC | PRN
  Administered 2021-11-15: 40 mg via INTRA_ARTICULAR

## 2021-11-15 MED ORDER — LIDOCAINE HCL 1 % IJ SOLN
0.5000 mL | INTRAMUSCULAR | Status: AC | PRN
  Administered 2021-11-15: .5 mL

## 2021-11-17 DIAGNOSIS — G5600 Carpal tunnel syndrome, unspecified upper limb: Secondary | ICD-10-CM | POA: Diagnosis not present

## 2021-11-24 DIAGNOSIS — D649 Anemia, unspecified: Secondary | ICD-10-CM | POA: Diagnosis not present

## 2021-11-24 DIAGNOSIS — I10 Essential (primary) hypertension: Secondary | ICD-10-CM | POA: Diagnosis not present

## 2021-11-26 DIAGNOSIS — G5600 Carpal tunnel syndrome, unspecified upper limb: Secondary | ICD-10-CM | POA: Diagnosis not present

## 2021-12-08 ENCOUNTER — Other Ambulatory Visit: Payer: Self-pay | Admitting: Gastroenterology

## 2021-12-08 DIAGNOSIS — D509 Iron deficiency anemia, unspecified: Secondary | ICD-10-CM | POA: Diagnosis not present

## 2021-12-08 DIAGNOSIS — R42 Dizziness and giddiness: Secondary | ICD-10-CM | POA: Diagnosis not present

## 2021-12-08 DIAGNOSIS — R7989 Other specified abnormal findings of blood chemistry: Secondary | ICD-10-CM | POA: Diagnosis not present

## 2021-12-08 DIAGNOSIS — R945 Abnormal results of liver function studies: Secondary | ICD-10-CM | POA: Diagnosis not present

## 2021-12-13 ENCOUNTER — Other Ambulatory Visit: Payer: Self-pay | Admitting: Family Medicine

## 2021-12-13 DIAGNOSIS — Z1231 Encounter for screening mammogram for malignant neoplasm of breast: Secondary | ICD-10-CM

## 2021-12-16 ENCOUNTER — Other Ambulatory Visit: Payer: Medicare PPO

## 2021-12-22 ENCOUNTER — Ambulatory Visit
Admission: RE | Admit: 2021-12-22 | Discharge: 2021-12-22 | Disposition: A | Discharge status: Home / Self Care | Payer: Medicare PPO | Source: Ambulatory Visit | Attending: Gastroenterology | Admitting: Gastroenterology

## 2021-12-22 DIAGNOSIS — R7989 Other specified abnormal findings of blood chemistry: Secondary | ICD-10-CM

## 2021-12-22 DIAGNOSIS — R945 Abnormal results of liver function studies: Secondary | ICD-10-CM | POA: Diagnosis not present

## 2021-12-24 ENCOUNTER — Ambulatory Visit: Payer: Medicare PPO | Admitting: Orthopaedic Surgery

## 2022-01-05 ENCOUNTER — Ambulatory Visit: Payer: Medicare PPO | Admitting: Orthopaedic Surgery

## 2022-01-12 ENCOUNTER — Ambulatory Visit: Payer: Medicare PPO | Admitting: Surgery

## 2022-01-13 ENCOUNTER — Telehealth: Payer: Self-pay

## 2022-01-13 ENCOUNTER — Ambulatory Visit: Payer: Medicare PPO | Admitting: Surgery

## 2022-01-13 ENCOUNTER — Encounter: Payer: Self-pay | Admitting: Surgery

## 2022-01-13 VITALS — Ht 64.0 in | Wt 220.0 lb

## 2022-01-13 DIAGNOSIS — M170 Bilateral primary osteoarthritis of knee: Secondary | ICD-10-CM | POA: Diagnosis not present

## 2022-01-13 NOTE — Progress Notes (Signed)
? ?Office Visit Note ?  ?Patient: Tiffany Scott           ?Date of Birth: [birth year]           ?MRN: [id number] ?Visit Date: 01/13/2022 ?             ?Requested by: Maurice Small, MD ?[address] ?[address] ?[place],  [place] [address] ?PCP: Maurice Small, MD ? ? ?Assessment & Plan: ?Visit Diagnoses:  ?1. Bilateral primary osteoarthritis of knee   ? ? ?Plan: I advised patient ultimately may come down to needing definitive treatment with right total knee replacement since that one is more symptomatic.  She is wanting to exhaust all conservative measures before going that route.  I will see about getting approval for bilateral knee Orthovisc series.  Follow-up in a couple weeks. ? ?Follow-Up Instructions: Return in about 2 weeks (around 01/27/2022) for With Fayrene Fearing to start bilateral knee viscosupplementation.  ? ?Orders:  ?No orders of the defined types were placed in this encounter. ? ?No orders of the defined types were placed in this encounter. ? ? ? ? Procedures: ?No procedures performed ? ? ?Clinical Data: ?No additional findings. ? ? ?Subjective: ?Chief Complaint  ?Patient presents with  ? Right Knee - Pain  ? Left Knee - Pain  ? ? ?HPI ?71 year old black female returns for recheck of her lumbar spine and bilateral knee pain.  Patient states that her low back is feeling better.  She continues have ongoing pain and swelling in the right knee.  Left knee also bothersome but less than the right.  Did not get any relief with previous intra-articular Marcaine/Depo-Medrol right knee injection. ?Review of Systems ?No current cardiopulmonary GI/GU issues ? ?Objective: ?Vital Signs: Ht 5\' 4"  (1.626 m)   Wt 220 lb (99.8 kg)   BMI 37.76 kg/m?  ? ?Physical Exam ?HENT:  ?   Head: Normocephalic and atraumatic.  ?Eyes:  ?   Extraocular Movements: Extraocular movements intact.  ?Musculoskeletal:  ?   Comments: Pleasant female alert and oriented in no acute stress.  Gait is antalgic.  Bilateral knees positive  crepitus.  Right greater than left knee medial/lateral joint line tenderness.  Right knee she does have small to moderate-sized tender Baker's cyst.  ?Neurological:  ?   Mental Status: She is alert.  ?Psychiatric:     ?   Mood and Affect: Mood normal.  ? ? ?Ortho Exam ? ?Specialty Comments:  ?No specialty comments available. ? ?Imaging: ?No results found. ? ? ?PMFS History: ?Patient Active Problem List  ? Diagnosis Date Noted  ? Bilateral primary osteoarthritis of knee 11/15/2021  ? Postmenopausal bleeding 07/27/2021  ? ?Past Medical History:  ?Diagnosis Date  ? Allergic rhinitis   ? DDD (degenerative disc disease), lumbar   ? History of COVID-19 2020  ? per pt moderate symptoms that resolved  ? Hypertension   ? followed by pcp  ? IDA (iron deficiency anemia)   ? OA (osteoarthritis)   ? knees  ? PMB (postmenopausal bleeding)   ? Sciatica, left side   ? Thickened endometrium   ? Wears glasses   ?  ?Family History  ?Problem Relation Age of Onset  ? Breast cancer Sister 41  ?  ?Past Surgical History:  ?Procedure Laterality Date  ? CATARACT EXTRACTION W/ INTRAOCULAR LENS IMPLANT Bilateral 2019  ? COLONOSCOPY  2015  ? DILATATION & CURETTAGE/HYSTEROSCOPY WITH MYOSURE N/A 07/27/2021  ? Procedure: DILATATION & CURETTAGE/HYSTEROSCOPY WITH MYOSURE;  Surgeon: 07/29/2021,  Delice Bison, MD;  Location: [facility];  Service: Gynecology;  Laterality: N/A;  ? TUBAL LIGATION Bilateral   ? age 77s  ? ?Social History  ? ?Occupational History  ? Not on file  ?Tobacco Use  ? Smoking status: Never  ? Smokeless tobacco: Never  ?Vaping Use  ? Vaping Use: Never used  ?Substance and Sexual Activity  ? Alcohol use: No  ? Drug use: Never  ? Sexual activity: Not on file  ? ? ? ? ? ? ?

## 2022-01-13 NOTE — Telephone Encounter (Signed)
VOB submitted for Orthovisc, bilateral knee. ?BV pending. ?

## 2022-01-19 ENCOUNTER — Ambulatory Visit: Payer: Medicare PPO | Admitting: Surgery

## 2022-01-24 ENCOUNTER — Ambulatory Visit
Admission: RE | Admit: 2022-01-24 | Discharge: 2022-01-24 | Disposition: A | Discharge status: Home / Self Care | Payer: Medicare PPO | Source: Ambulatory Visit | Attending: Family Medicine | Admitting: Family Medicine

## 2022-01-24 DIAGNOSIS — Z1231 Encounter for screening mammogram for malignant neoplasm of breast: Secondary | ICD-10-CM | POA: Diagnosis not present

## 2022-01-27 ENCOUNTER — Encounter: Payer: Self-pay | Admitting: Surgery

## 2022-01-27 ENCOUNTER — Other Ambulatory Visit: Payer: Self-pay

## 2022-01-27 ENCOUNTER — Ambulatory Visit: Payer: Medicare PPO | Admitting: Surgery

## 2022-01-27 DIAGNOSIS — M1711 Unilateral primary osteoarthritis, right knee: Secondary | ICD-10-CM

## 2022-01-27 DIAGNOSIS — M170 Bilateral primary osteoarthritis of knee: Secondary | ICD-10-CM | POA: Diagnosis not present

## 2022-01-27 DIAGNOSIS — M1712 Unilateral primary osteoarthritis, left knee: Secondary | ICD-10-CM

## 2022-01-27 MED ORDER — HYALURONAN 30 MG/2ML IX SOSY
30.0000 mg | PREFILLED_SYRINGE | INTRA_ARTICULAR | Status: AC | PRN
  Administered 2022-01-27: 30 mg via INTRA_ARTICULAR

## 2022-01-27 MED ORDER — LIDOCAINE HCL 1 % IJ SOLN
3.0000 mL | INTRAMUSCULAR | Status: AC | PRN
  Administered 2022-01-27: 3 mL

## 2022-01-27 NOTE — Progress Notes (Signed)
Office Visit Note   Patient: Kennley Schwandt [facility]           Date of Birth: 1950/12/06           MRN: [id number] Visit Date: 01/27/2022              Requested by: Maurice Small, MD [address] [place],  [place] [address] PCP: Maurice Small, MD   Assessment & Plan: Visit Diagnoses:  1. Unilateral primary osteoarthritis, left knee   2. Unilateral primary osteoarthritis, right knee     Plan: Today after patient consent bodies were prepped Betadine and bilateral knee intra-articular Orthovisc injections #1 of 3 performed.  Tolerated without complication.  Have patient follow-up in 1 week for injections #2.  Follow-Up Instructions: Return in about 1 week (around 02/03/2022) for with Yarethzi Branan bilat knee injections.   Orders:  Orders Placed This Encounter  Procedures   Large Joint Inj   No orders of the defined types were placed in this encounter.     Procedures: Large Joint Inj: bilateral knee on 01/27/2022 2:10 PM Indications: pain Details: 25 G 1.5 in needle, anteromedial approach Medications (Right): 3 mL lidocaine 1 %; 30 mg Hyaluronan 30 MG/2ML Medications (Left): 3 mL lidocaine 1 %; 30 mg Hyaluronan 30 MG/2ML Consent was given by the patient. Patient was prepped and draped in the usual sterile fashion.      Clinical Data: No additional findings.   Subjective: Chief Complaint  Patient presents with   Right Knee - Injections   Left Knee - Injections    HPI 71 year old black female history of end-stage DJD bilateral knees comes in to start Orthovisc series.  Knee symptoms unchanged from previous visit. Review of Systems No current cardiopulmonary GI/GU issues  Objective: Vital Signs: There were no vitals taken for this visit.  Physical Exam HENT:     Head: Normocephalic.     Nose: Nose normal.  Eyes:     Extraocular Movements: Extraocular movements intact.  Pulmonary:     Effort: No respiratory distress.  Musculoskeletal:     Comments:  Bilateral knee patellofemoral crepitus.  Bilateral knee joint line tenderness.  Neurological:     Mental Status: She is alert and oriented to person, place, and time.  Psychiatric:        Mood and Affect: Mood normal.    Ortho Exam  Specialty Comments:  No specialty comments available.  Imaging: No results found.   PMFS History: Patient Active Problem List   Diagnosis Date Noted   Bilateral primary osteoarthritis of knee 11/15/2021   Postmenopausal bleeding 07/27/2021   Past Medical History:  Diagnosis Date   Allergic rhinitis    DDD (degenerative disc disease), lumbar    History of COVID-19 2020   per pt moderate symptoms that resolved   Hypertension    followed by pcp   IDA (iron deficiency anemia)    OA (osteoarthritis)    knees   PMB (postmenopausal bleeding)    Sciatica, left side    Thickened endometrium    Wears glasses     Family History  Problem Relation Age of Onset   Breast cancer Sister 16    Past Surgical History:  Procedure Laterality Date   CATARACT EXTRACTION W/ INTRAOCULAR LENS IMPLANT Bilateral 2019   COLONOSCOPY  2015   DILATATION & CURETTAGE/HYSTEROSCOPY WITH MYOSURE N/A 07/27/2021   Procedure: DILATATION & CURETTAGE/HYSTEROSCOPY WITH MYOSURE;  Surgeon: Gerald Leitz, MD;  Location: [facility];  Service:  Gynecology;  Laterality: N/A;   TUBAL LIGATION Bilateral    age 71s   Social History   Occupational History   Not on file  Tobacco Use   Smoking status: Never   Smokeless tobacco: Never  Vaping Use   Vaping Use: Never used  Substance and Sexual Activity   Alcohol use: No   Drug use: Never   Sexual activity: Not on file

## 2022-01-31 ENCOUNTER — Other Ambulatory Visit: Payer: Self-pay | Admitting: Obstetrics and Gynecology

## 2022-01-31 ENCOUNTER — Other Ambulatory Visit (HOSPITAL_COMMUNITY)
Admission: RE | Admit: 2022-01-31 | Discharge: 2022-01-31 | Disposition: A | Discharge status: Home / Self Care | Payer: Medicare PPO | Source: Ambulatory Visit | Attending: Obstetrics and Gynecology | Admitting: Obstetrics and Gynecology

## 2022-01-31 DIAGNOSIS — B009 Herpesviral infection, unspecified: Secondary | ICD-10-CM | POA: Diagnosis not present

## 2022-01-31 DIAGNOSIS — Z780 Asymptomatic menopausal state: Secondary | ICD-10-CM | POA: Insufficient documentation

## 2022-01-31 DIAGNOSIS — Z8742 Personal history of other diseases of the female genital tract: Secondary | ICD-10-CM | POA: Insufficient documentation

## 2022-01-31 DIAGNOSIS — Z01419 Encounter for gynecological examination (general) (routine) without abnormal findings: Secondary | ICD-10-CM | POA: Diagnosis not present

## 2022-01-31 DIAGNOSIS — Z1151 Encounter for screening for human papillomavirus (HPV): Secondary | ICD-10-CM | POA: Diagnosis not present

## 2022-01-31 DIAGNOSIS — Z9189 Other specified personal risk factors, not elsewhere classified: Secondary | ICD-10-CM | POA: Diagnosis not present

## 2022-02-01 LAB — CYTOLOGY - PAP
Comment: NEGATIVE
Diagnosis: NEGATIVE
High risk HPV: NEGATIVE

## 2022-02-10 ENCOUNTER — Ambulatory Visit: Payer: Medicare PPO | Admitting: Surgery

## 2022-02-14 ENCOUNTER — Other Ambulatory Visit: Payer: Self-pay | Admitting: Gastroenterology

## 2022-02-14 ENCOUNTER — Other Ambulatory Visit (HOSPITAL_COMMUNITY): Payer: Self-pay | Admitting: Gastroenterology

## 2022-02-14 DIAGNOSIS — R748 Abnormal levels of other serum enzymes: Secondary | ICD-10-CM

## 2022-02-14 DIAGNOSIS — D509 Iron deficiency anemia, unspecified: Secondary | ICD-10-CM | POA: Diagnosis not present

## 2022-02-14 DIAGNOSIS — K760 Fatty (change of) liver, not elsewhere classified: Secondary | ICD-10-CM | POA: Diagnosis not present

## 2022-02-21 ENCOUNTER — Other Ambulatory Visit: Payer: Self-pay | Admitting: Radiology

## 2022-02-21 DIAGNOSIS — R748 Abnormal levels of other serum enzymes: Secondary | ICD-10-CM

## 2022-02-21 NOTE — H&P (Signed)
Chief Complaint: Patient was seen in consultation today for abnormal labs  at the request of TiffanyBayley Scott  Referring Physician(s): TiffanyBayley Scott  Supervising Physician: Tiffany Scott, Glenn  Patient Status: [facility] - Out-pt  History of Present Illness: Tiffany Scott is a 71 y.o. female with PMH of HTN, IDA and OA.Pt being [facility] by [facility] for elevated LFTs. Pt had  US abdomen 12/22/21 that demonstrated increased echogenicity in liver. Pt was referred to IR by Boone MasterBayley McMichael, PA, for random liver biopsy due to elevated alkaline phosphatase level.   US Abdomen Limited RUQ 12/22/21: IMPRESSION: 1. Increased echogenicity in the liver is nonspecific but often due to hepatic steatosis. 2. No other abnormalities.    Past Medical History:  Diagnosis Date   Allergic rhinitis    DDD (degenerative disc disease), lumbar    History of COVID-19 2020   per pt moderate symptoms that resolved   Hypertension    followed by pcp   IDA (iron deficiency anemia)    OA (osteoarthritis)    knees   PMB (postmenopausal bleeding)    Sciatica, left side    Thickened endometrium    Wears glasses     Past Surgical History:  Procedure Laterality Date   CATARACT EXTRACTION W/ INTRAOCULAR LENS IMPLANT Bilateral 2019   COLONOSCOPY  2015   DILATATION & CURETTAGE/HYSTEROSCOPY WITH MYOSURE N/A 07/27/2021   Procedure: DILATATION & CURETTAGE/HYSTEROSCOPY WITH MYOSURE;  Surgeon: Tiffany Scott, Tara, MD;  [facility];  Service: Gynecology;  Laterality: N/A;   TUBAL LIGATION Bilateral    age [age]s    Allergies: Ace inhibitors  Medications: Prior to Admission medications   Medication Sig Start Date End Date Taking? Authorizing Provider  acetaminophen (TYLENOL) 500 MG tablet Take 2 tablets (1,000 mg total) by mouth every 8 (eight) hours as needed for moderate pain. 07/27/21   Tiffany Scott, Tara, MD  cetirizine (ZYRTEC) 10 MG tablet Take 10 mg by mouth as needed for  allergies.    [provider]  fluticasone (FLONASE) 50 MCG/ACT nasal spray Place into both nostrils as needed for allergies or rhinitis.    [provider]  ibuprofen (ADVIL) 800 MG tablet Take 1 tablet (800 mg total) by mouth every 8 (eight) hours as needed. 07/27/21   Tiffany Scott, Tara, MD  iron polysaccharides (NIFEREX) 150 MG capsule Take 150 mg by mouth daily.    [provider]  losartan-hydrochlorothiazide (HYZAAR) 100-12.5 MG tablet Take 1 tablet by mouth daily.    [provider]  medroxyPROGESTERone (PROVERA) 10 MG tablet Take 10 mg by mouth daily.    [provider]  montelukast (SINGULAIR) 10 MG tablet Take 10 mg by mouth at bedtime.    [provider]  Multiple Vitamins-Minerals (MULTIVITAMIN WITH MINERALS) tablet Take 1 tablet by mouth daily.    [provider]  valACYclovir (VALTREX) 1000 MG tablet Take 1,000 mg by mouth daily.    [provider]     Family History  Problem Relation Age of Onset   Breast cancer Sister 2961    Social History   Socioeconomic History   Marital status: Married    Spouse name: Not on file   Number of children: Not on file   Years of education: Not on file   Highest education level: Not on file  Occupational History   Not on file  Tobacco Use   Smoking status: Never   Smokeless tobacco: Never  Vaping Use   Vaping Use: Never used  Substance and Sexual Activity   Alcohol use: No   Drug use: Never   Sexual activity: Not on file  Other Topics Concern   Not on file  Social History Narrative   Not on file   Social Determinants of Health   Financial Resource Strain: Not on file  Food Insecurity: Not on file  Transportation Needs: Not on file  Physical Activity: Not on file  Stress: Not on file  Social Connections: Not on file    Review of Systems: A 12 point ROS discussed and pertinent positives are indicated in the HPI above.  All other systems are negative.  Review  of Systems  All other systems reviewed and are negative.   Vital Signs: Pulse 84   Temp 97.7 F (36.5 C) (Oral)   Resp 18   Ht 5\' 4"  (1.626 Scott)   Wt 213 lb (96.6 kg)   SpO2 98%   BMI 36.56 kg/Scott   Physical Exam Vitals reviewed.  Constitutional:      General: She is not in acute distress.    Appearance: Normal appearance.  HENT:     Mouth/Throat:     Mouth: Mucous membranes are moist.     Pharynx: Oropharynx is clear.  Eyes:     Extraocular Movements: Extraocular movements intact.     Pupils: Pupils are equal, round, and reactive to light.  Cardiovascular:     Rate and Rhythm: Normal rate and regular rhythm.     Pulses: Normal pulses.     Heart sounds: Normal heart sounds.  Pulmonary:     Effort: Pulmonary effort is normal. No respiratory distress.     Breath sounds: Normal breath sounds.  Musculoskeletal:     Right lower leg: Edema present.     Left lower leg: Edema present.  Skin:    General: Skin is warm and dry.  Neurological:     Mental Status: She is alert and oriented to person, place, and time.     Imaging: MM 3D SCREEN BREAST BILATERAL  Result Date: 01/24/2022 CLINICAL DATA:  Screening. EXAM: DIGITAL SCREENING BILATERAL MAMMOGRAM WITH TOMOSYNTHESIS AND CAD TECHNIQUE: Bilateral screening digital craniocaudal and mediolateral oblique mammograms were obtained. Bilateral screening digital breast tomosynthesis was performed. The images were evaluated with computer-aided detection. COMPARISON:  Previous exam(s). ACR Breast Density Category b: There are scattered areas of fibroglandular density. FINDINGS: There are no findings suspicious for malignancy. IMPRESSION: No mammographic evidence of malignancy. A result letter of this screening mammogram will be mailed directly to the patient. RECOMMENDATION: Screening mammogram in one year. (Code:SM-B-01Y) BI-RADS CATEGORY  1: Negative. Electronically Signed   By: 01/26/2022 Scott.D.   On: 01/24/2022 16:39     Labs:  CBC: Recent Labs    07/27/21 0850  HGB 11.9*  HCT 35.0*    COAGS: No results for input(s): "INR", "APTT" in the last 8760 hours.  BMP: Recent Labs    07/27/21 0850  NA 141  K 4.1  CL 104  GLUCOSE 127*  BUN 16  CREATININE 0.70    LIVER FUNCTION TESTS: No results for input(s): "BILITOT", "AST", "ALT", "ALKPHOS", "PROT", "ALBUMIN" in the last 8760 hours.  TUMOR MARKERS: No results for input(s): "AFPTM", "CEA", "CA199", "CHROMGRNA" in the last 8760 hours.  Assessment and Plan: History of HTN, IDA and OA.Pt being followed by [facility] for elevated LFTs. Pt had  [facility] abdomen 12/22/21 that demonstrated increased echogenicity in liver. Pt was referred to IR by 02/21/22, PA, for random  liver biopsy due to elevated alkaline phosphatase level.   US Abdomen Limited RUQ 12/22/21: IMPRESSION: 1. Increased echogenicity in the liver is nonspecific but often due to hepatic steatosis. 2. No other abnormalities.  Pt is A&O. She is in no distress.  Pt is NPO per order No AC/AP  Risks and benefits of random liver biopsy with moderate sedation was discussed with the patient and/or patient's family including, but not limited to bleeding, infection, damage to adjacent structures or low yield requiring additional tests.  All of the questions were answered and there is agreement to proceed.  Consent signed and in chart.   Thank you for this interesting consult.  I greatly enjoyed meeting Chaz Ronning Mclarty and look forward to participating in their care.  A copy of this report was sent to the requesting provider on this date.  Electronically Signed: Shon Hough, NP 02/22/2022, 12:42 PM   I spent a total of 20 minutes in face to face in clinical consultation, greater than 50% of which was counseling/coordinating care for elevated alkaline phosphatase level.

## 2022-02-22 ENCOUNTER — Other Ambulatory Visit: Payer: Self-pay

## 2022-02-22 ENCOUNTER — Ambulatory Visit (HOSPITAL_COMMUNITY)
Admission: RE | Admit: 2022-02-22 | Discharge: 2022-02-22 | Disposition: A | Discharge status: Home / Self Care | Payer: Medicare PPO | Source: Ambulatory Visit | Attending: Gastroenterology | Admitting: Gastroenterology

## 2022-02-22 ENCOUNTER — Encounter (HOSPITAL_COMMUNITY): Payer: Self-pay

## 2022-02-22 DIAGNOSIS — R748 Abnormal levels of other serum enzymes: Secondary | ICD-10-CM | POA: Diagnosis not present

## 2022-02-22 DIAGNOSIS — D509 Iron deficiency anemia, unspecified: Secondary | ICD-10-CM | POA: Insufficient documentation

## 2022-02-22 DIAGNOSIS — I10 Essential (primary) hypertension: Secondary | ICD-10-CM | POA: Insufficient documentation

## 2022-02-22 DIAGNOSIS — M1990 Unspecified osteoarthritis, unspecified site: Secondary | ICD-10-CM | POA: Insufficient documentation

## 2022-02-22 DIAGNOSIS — R7989 Other specified abnormal findings of blood chemistry: Secondary | ICD-10-CM | POA: Insufficient documentation

## 2022-02-22 DIAGNOSIS — K760 Fatty (change of) liver, not elsewhere classified: Secondary | ICD-10-CM | POA: Diagnosis not present

## 2022-02-22 DIAGNOSIS — K7581 Nonalcoholic steatohepatitis (NASH): Secondary | ICD-10-CM | POA: Diagnosis not present

## 2022-02-22 LAB — COMPREHENSIVE METABOLIC PANEL
ALT: 11 U/L (ref 0–44)
AST: 19 U/L (ref 15–41)
Albumin: 3.7 g/dL (ref 3.5–5.0)
Alkaline Phosphatase: 147 U/L — ABNORMAL HIGH (ref 38–126)
Anion gap: 9 (ref 5–15)
BUN: 15 mg/dL (ref 8–23)
CO2: 28 mmol/L (ref 22–32)
Calcium: 9.3 mg/dL (ref 8.9–10.3)
Chloride: 103 mmol/L (ref 98–111)
Creatinine, Ser: 0.7 mg/dL (ref 0.44–1.00)
GFR, Estimated: >60 mL/min (ref >60)
Glucose, Bld: 96 mg/dL (ref 70–99)
Potassium: 3.8 mmol/L (ref 3.5–5.1)
Sodium: 140 mmol/L (ref 135–145)
Total Bilirubin: 0.5 mg/dL (ref 0.3–1.2)
Total Protein: 9.1 g/dL — ABNORMAL HIGH (ref 6.5–8.1)

## 2022-02-22 LAB — CBC WITH DIFFERENTIAL/PLATELET
Abs Immature Granulocytes: 0.02 10*3/uL (ref 0.00–0.07)
Basophils Absolute: 0 10*3/uL (ref 0.0–0.1)
Basophils Relative: 0 %
Eosinophils Absolute: 0.1 10*3/uL (ref 0.0–0.5)
Eosinophils Relative: 1 %
HCT: 35.5 % — ABNORMAL LOW (ref 36.0–46.0)
Hemoglobin: 11.2 g/dL — ABNORMAL LOW (ref 12.0–15.0)
Immature Granulocytes: 0 %
Lymphocytes Relative: 36 %
Lymphs Abs: 3 10*3/uL (ref 0.7–4.0)
MCH: 27.8 pg (ref 26.0–34.0)
MCHC: 31.5 g/dL (ref 30.0–36.0)
MCV: 88.1 fL (ref 80.0–100.0)
Monocytes Absolute: 0.6 10*3/uL (ref 0.1–1.0)
Monocytes Relative: 7 %
Neutro Abs: 4.7 10*3/uL (ref 1.7–7.7)
Neutrophils Relative %: 56 %
Platelets: 298 10*3/uL (ref 150–400)
RBC: 4.03 MIL/uL (ref 3.87–5.11)
RDW: 14.3 % (ref 11.5–15.5)
WBC: 8.4 10*3/uL (ref 4.0–10.5)
nRBC: 0 % (ref 0.0–0.2)

## 2022-02-22 LAB — PROTIME-INR
INR: 1 (ref 0.8–1.2)
Prothrombin Time: 12.8 seconds (ref 11.4–15.2)

## 2022-02-22 MED ORDER — SODIUM CHLORIDE 0.9 % IV SOLN: INTRAVENOUS | Status: DC

## 2022-02-22 MED ORDER — MIDAZOLAM HCL 2 MG/2ML IJ SOLN
INTRAMUSCULAR | Status: AC | PRN
  Administered 2022-02-22: 1 mg via INTRAVENOUS

## 2022-02-22 MED ORDER — MIDAZOLAM HCL 2 MG/2ML IJ SOLN
INTRAMUSCULAR | Status: AC
  Filled 2022-02-22: qty 2

## 2022-02-22 MED ORDER — LIDOCAINE HCL 1 % IJ SOLN
INTRAMUSCULAR | Status: AC
  Administered 2022-02-22: 10 mL
  Filled 2022-02-22: qty 20

## 2022-02-22 MED ORDER — GELATIN ABSORBABLE 12-7 MM EX MISC
CUTANEOUS | Status: AC
  Administered 2022-02-22: 1
  Filled 2022-02-22: qty 1

## 2022-02-22 MED ORDER — FENTANYL CITRATE (PF) 100 MCG/2ML IJ SOLN
INTRAMUSCULAR | Status: AC
  Filled 2022-02-22: qty 2

## 2022-02-22 MED ORDER — FENTANYL CITRATE (PF) 100 MCG/2ML IJ SOLN
INTRAMUSCULAR | Status: AC | PRN
  Administered 2022-02-22: 50 ug via INTRAVENOUS

## 2022-02-22 NOTE — Discharge Instructions (Signed)
Please call Interventional Radiology clinic [phone] with any questions or concerns.   You may remove your dressing and shower tomorrow.   Liver Biopsy, Care After After a liver biopsy, it is common to have these things in the area where the biopsy was done. You may: Have pain. Feel sore. Have bruising. You may also feel tired for a few days. Follow these instructions at home: Medicines Take over-the-counter and prescription medicines only as told by your doctor. If you were prescribed an antibiotic medicine, take it as told by your doctor. Do not stop taking the antibiotic, even if you start to feel better. Do not take medicines that may thin your blood. These medicines include aspirin and ibuprofen. Take them only if your doctor tells you to. If told, take steps to prevent problems with pooping (constipation). You may need to: Drink enough fluid to keep your pee (urine) pale yellow. Take medicines. You will be told what medicines to take. Eat foods that are high in fiber. These include beans, whole grains, and fresh fruits and vegetables. Limit foods that are high in fat and sugar. These include fried or sweet foods. Ask your doctor if you should avoid driving or using machines while you are taking your medicine. Caring for your incision Follow instructions from your doctor about how to take care of your cut from surgery (incisions). Make sure you: Wash your hands with soap and water for at least 20 seconds before and after you change your bandage. If you cannot use soap and water, use hand sanitizer. Change your bandage. Leavestitches or skin glue in place for at least two weeks. Leave tape strips alone unless you are told to take them off. You may trim the edges of the tape strips if they curl up. Check your incision every day for signs of infection. Check for: Redness, swelling, or more pain. Fluid or blood. Warmth. Pus or a bad smell. Do not take baths, swim, or use a  hot tub. Ask your doctor about taking showers or sponge baths. Activity Rest at home for 1-2 days, or as told by your doctor. Get up to take short walks every 1 to 2 hours. Ask for help if you feel weak or unsteady. Do not lift anything that is heavier than 10 lb (4.5 kg), or the limit that you are told. Do not play contact sports for 2 weeks after the procedure. Return to your normal activities as told by your doctor. Ask what activities are safe for you. General instructions  Do not drink alcohol in the first week after the procedure. Plan to have a responsible adult care for you for the time you are told after you leave the hospital or clinic. This is important. It is up to you to get the results of your procedure. Ask how to get your results when they are ready. Keep all follow-up visits.   Contact a doctor if: You have more bleeding in your incision. Your incision swells, or is red and more painful. You have fluid that comes from your incision. You develop a rash. You have fever or chills. Get help right away if: You have swelling, bloating, or pain in your belly (abdomen). You get dizzy or faint. You vomit or you feel like vomiting. You have trouble breathing or feel short of breath. You have chest pain. You have problems talking or seeing. You have trouble with your balance or moving your arms or legs. These symptoms may be an emergency. Get help   right away. Call your local emergency services (911 in the [place] Do not wait to see if the symptoms will go away. Do not drive yourself to the hospital. Summary After the procedure, it is common to have pain, soreness, bruising, and tiredness. Your doctor will tell you how to take care of yourself at home. Change your bandage, take your medicines, and limit your activities as told by your doctor. Call your doctor if you have symptoms of infection. Get help right away if your belly swells, your cut bleeds a lot, or you have trouble  talking or breathing. This information is not intended to replace advice given to you by your health care provider. Make sure you discuss any questions you have with your healthcare provider. Document Revised: 07/13/2020 Document Reviewed: 07/13/2020 Elsevier Patient Education  2022 Else[employer] Inc[employer]     Moderate Conscious Sedation, Adult, Care After This sheet gives you information about how to care for yourself after your procedure. Your health care provider may also give you more specific instructions. If you have problems or questions, contact your health care provider. What can I expect after the procedure? After the procedure, it is common to have: Sleepiness for several hours. Impaired judgment for several hours. Difficulty with balance. Vomiting if you eat too soon. Follow these instructions at home: For the time period you were told by your health care provider: Rest. Do not participate in activities where you could fall or become injured. Do not drive or use machinery. Do not drink alcohol. Do not take sleeping pills or medicines that cause drowsiness. Do not make important decisions or sign legal documents. Do not take care of children on your own.        Eating and drinking Follow the diet recommended by your health care provider. Drink enough fluid to keep your urine pale yellow. If you vomit: Drink water, juice, or soup when you can drink without vomiting. Make sure you have little or no nausea before eating solid foods.    General instructions Take over-the-counter and prescription medicines only as told by your health care provider. Have a responsible adult stay with you for the time you are told. It is important to have someone help care for you until you are awake and alert. Do not smoke. Keep all follow-up visits as told by your health care provider. This is important. Contact a health care provider if: You are still sleepy or having trouble with balance after  24 hours. You feel light-headed. You keep feeling nauseous or you keep vomiting. You develop a rash. You have a fever. You have redness or swelling around the IV site. Get help right away if: You have trouble breathing. You have new-onset confusion at home. Summary After the procedure, it is common to feel sleepy, have impaired judgment, or feel nauseous if you eat too soon. Rest after you get home. Know the things you should not do after the procedure. Follow the diet recommended by your health care provider and drink enough fluid to keep your urine pale yellow. Get help right away if you have trouble breathing or new-onset confusion at home. This information is not intended to replace advice given to you by your health care provider. Make sure you discuss any questions you have with your health care provider. Document Revised: 12/27/2019 Document Reviewed: 07/25/2019 Elsevier Patient Education  2021 Elsevier Inc.    

## 2022-02-22 NOTE — Procedures (Signed)
Interventional Radiology Procedure Note ° °Procedure: US Guided Biopsy of liver ° °Complications: None ° °Estimated Blood Loss: < 10 mL ° °Findings: °18 G core biopsy of liver performed under US guidance.  Three core samples obtained and sent to Pathology. ° °Elza Varricchio T. Eriq Hufford, M.D °Pager:  [phone] ° °  °

## 2022-02-24 ENCOUNTER — Ambulatory Visit (HOSPITAL_COMMUNITY): Payer: Medicare PPO

## 2022-02-24 LAB — SURGICAL PATHOLOGY

## 2022-02-25 DIAGNOSIS — J309 Allergic rhinitis, unspecified: Secondary | ICD-10-CM | POA: Diagnosis not present

## 2022-03-03 ENCOUNTER — Ambulatory Visit: Payer: Medicare PPO | Admitting: Surgery

## 2022-03-07 DIAGNOSIS — K760 Fatty (change of) liver, not elsewhere classified: Secondary | ICD-10-CM | POA: Diagnosis not present

## 2022-03-07 DIAGNOSIS — Z0000 Encounter for general adult medical examination without abnormal findings: Secondary | ICD-10-CM | POA: Diagnosis not present

## 2022-03-07 DIAGNOSIS — Z6841 Body Mass Index (BMI) 40.0 and over, adult: Secondary | ICD-10-CM | POA: Diagnosis not present

## 2022-03-07 DIAGNOSIS — M5442 Lumbago with sciatica, left side: Secondary | ICD-10-CM | POA: Diagnosis not present

## 2022-03-07 DIAGNOSIS — R748 Abnormal levels of other serum enzymes: Secondary | ICD-10-CM | POA: Diagnosis not present

## 2022-03-07 DIAGNOSIS — I10 Essential (primary) hypertension: Secondary | ICD-10-CM | POA: Diagnosis not present

## 2022-03-07 DIAGNOSIS — M5136 Other intervertebral disc degeneration, lumbar region: Secondary | ICD-10-CM | POA: Diagnosis not present

## 2022-03-07 DIAGNOSIS — D509 Iron deficiency anemia, unspecified: Secondary | ICD-10-CM | POA: Diagnosis not present

## 2022-03-07 DIAGNOSIS — J069 Acute upper respiratory infection, unspecified: Secondary | ICD-10-CM | POA: Diagnosis not present

## 2022-03-07 DIAGNOSIS — M170 Bilateral primary osteoarthritis of knee: Secondary | ICD-10-CM | POA: Diagnosis not present

## 2022-03-09 ENCOUNTER — Other Ambulatory Visit: Payer: Self-pay | Admitting: Internal Medicine

## 2022-03-09 DIAGNOSIS — E2839 Other primary ovarian failure: Secondary | ICD-10-CM

## 2022-03-14 DIAGNOSIS — K3189 Other diseases of stomach and duodenum: Secondary | ICD-10-CM | POA: Diagnosis not present

## 2022-03-14 DIAGNOSIS — K649 Unspecified hemorrhoids: Secondary | ICD-10-CM | POA: Diagnosis not present

## 2022-03-14 DIAGNOSIS — K219 Gastro-esophageal reflux disease without esophagitis: Secondary | ICD-10-CM | POA: Diagnosis not present

## 2022-03-14 DIAGNOSIS — K5730 Diverticulosis of large intestine without perforation or abscess without bleeding: Secondary | ICD-10-CM | POA: Diagnosis not present

## 2022-03-14 DIAGNOSIS — R58 Hemorrhage, not elsewhere classified: Secondary | ICD-10-CM | POA: Diagnosis not present

## 2022-03-14 DIAGNOSIS — D509 Iron deficiency anemia, unspecified: Secondary | ICD-10-CM | POA: Diagnosis not present

## 2022-03-14 DIAGNOSIS — K449 Diaphragmatic hernia without obstruction or gangrene: Secondary | ICD-10-CM | POA: Diagnosis not present

## 2022-03-31 ENCOUNTER — Ambulatory Visit: Payer: Medicare PPO | Admitting: Surgery

## 2022-06-24 ENCOUNTER — Ambulatory Visit: Payer: Medicare PPO | Admitting: Orthopaedic Surgery

## 2022-07-08 ENCOUNTER — Ambulatory Visit: Payer: Medicare PPO | Admitting: Orthopaedic Surgery

## 2022-08-08 DIAGNOSIS — N950 Postmenopausal bleeding: Secondary | ICD-10-CM | POA: Diagnosis not present

## 2022-08-22 DIAGNOSIS — R9389 Abnormal findings on diagnostic imaging of other specified body structures: Secondary | ICD-10-CM | POA: Diagnosis not present

## 2022-08-22 DIAGNOSIS — N950 Postmenopausal bleeding: Secondary | ICD-10-CM | POA: Diagnosis not present

## 2022-08-31 ENCOUNTER — Ambulatory Visit
Admission: RE | Admit: 2022-08-31 | Discharge: 2022-08-31 | Disposition: A | Discharge status: Home / Self Care | Payer: Medicare PPO | Source: Ambulatory Visit | Attending: Internal Medicine | Admitting: Internal Medicine

## 2022-08-31 DIAGNOSIS — Z780 Asymptomatic menopausal state: Secondary | ICD-10-CM | POA: Diagnosis not present

## 2022-08-31 DIAGNOSIS — E2839 Other primary ovarian failure: Secondary | ICD-10-CM

## 2022-09-19 DIAGNOSIS — K760 Fatty (change of) liver, not elsewhere classified: Secondary | ICD-10-CM | POA: Diagnosis not present

## 2022-09-19 DIAGNOSIS — I10 Essential (primary) hypertension: Secondary | ICD-10-CM | POA: Diagnosis not present

## 2022-09-19 DIAGNOSIS — R42 Dizziness and giddiness: Secondary | ICD-10-CM | POA: Diagnosis not present

## 2022-09-19 DIAGNOSIS — D509 Iron deficiency anemia, unspecified: Secondary | ICD-10-CM | POA: Diagnosis not present

## 2022-09-20 ENCOUNTER — Other Ambulatory Visit: Payer: Self-pay | Admitting: Obstetrics and Gynecology

## 2022-12-15 ENCOUNTER — Other Ambulatory Visit: Payer: Self-pay | Admitting: Internal Medicine

## 2022-12-15 DIAGNOSIS — Z1231 Encounter for screening mammogram for malignant neoplasm of breast: Secondary | ICD-10-CM

## 2023-01-12 ENCOUNTER — Other Ambulatory Visit: Payer: Self-pay | Admitting: Physician Assistant

## 2023-01-12 ENCOUNTER — Ambulatory Visit
Admission: RE | Admit: 2023-01-12 | Discharge: 2023-01-12 | Disposition: A | Discharge status: Home / Self Care | Payer: Medicare PPO | Source: Ambulatory Visit | Attending: Physician Assistant | Admitting: Physician Assistant

## 2023-01-12 DIAGNOSIS — R109 Unspecified abdominal pain: Secondary | ICD-10-CM

## 2023-01-12 DIAGNOSIS — I10 Essential (primary) hypertension: Secondary | ICD-10-CM | POA: Diagnosis not present

## 2023-01-12 DIAGNOSIS — R8290 Unspecified abnormal findings in urine: Secondary | ICD-10-CM | POA: Diagnosis not present

## 2023-01-24 ENCOUNTER — Other Ambulatory Visit: Payer: Self-pay | Admitting: Physician Assistant

## 2023-01-24 DIAGNOSIS — R109 Unspecified abdominal pain: Secondary | ICD-10-CM

## 2023-01-26 ENCOUNTER — Ambulatory Visit
Admission: RE | Admit: 2023-01-26 | Discharge: 2023-01-26 | Disposition: A | Discharge status: Home / Self Care | Payer: Medicare PPO | Source: Ambulatory Visit | Attending: Physician Assistant | Admitting: Physician Assistant

## 2023-01-26 DIAGNOSIS — K449 Diaphragmatic hernia without obstruction or gangrene: Secondary | ICD-10-CM | POA: Diagnosis not present

## 2023-01-26 DIAGNOSIS — R319 Hematuria, unspecified: Secondary | ICD-10-CM | POA: Diagnosis not present

## 2023-01-26 DIAGNOSIS — R1032 Left lower quadrant pain: Secondary | ICD-10-CM | POA: Diagnosis not present

## 2023-01-26 DIAGNOSIS — K5730 Diverticulosis of large intestine without perforation or abscess without bleeding: Secondary | ICD-10-CM | POA: Diagnosis not present

## 2023-01-26 DIAGNOSIS — R109 Unspecified abdominal pain: Secondary | ICD-10-CM

## 2023-01-27 ENCOUNTER — Ambulatory Visit
Admission: RE | Admit: 2023-01-27 | Discharge: 2023-01-27 | Disposition: A | Discharge status: Home / Self Care | Payer: Medicare PPO | Source: Ambulatory Visit | Attending: Internal Medicine | Admitting: Internal Medicine

## 2023-01-27 DIAGNOSIS — Z1231 Encounter for screening mammogram for malignant neoplasm of breast: Secondary | ICD-10-CM | POA: Diagnosis not present

## 2023-02-07 DIAGNOSIS — R42 Dizziness and giddiness: Secondary | ICD-10-CM | POA: Diagnosis not present

## 2023-02-07 DIAGNOSIS — Z6837 Body mass index (BMI) 37.0-37.9, adult: Secondary | ICD-10-CM | POA: Diagnosis not present

## 2023-02-07 DIAGNOSIS — Z9181 History of falling: Secondary | ICD-10-CM | POA: Diagnosis not present

## 2023-02-07 DIAGNOSIS — R109 Unspecified abdominal pain: Secondary | ICD-10-CM | POA: Diagnosis not present

## 2023-02-19 IMAGING — MG MM DIGITAL SCREENING BILAT W/ TOMO AND CAD
8 series · 8 of 24 positions shown · non-contrast
Comparison: Previous exam(s).

CLINICAL DATA: Screening.

EXAM:
DIGITAL SCREENING BILATERAL MAMMOGRAM WITH TOMOSYNTHESIS AND CAD
TECHNIQUE: Bilateral screening digital craniocaudal and mediolateral oblique
mammograms were obtained. Bilateral screening digital breast
tomosynthesis was performed. The images were evaluated with
computer-aided detection.

[L MLO synth-2D]
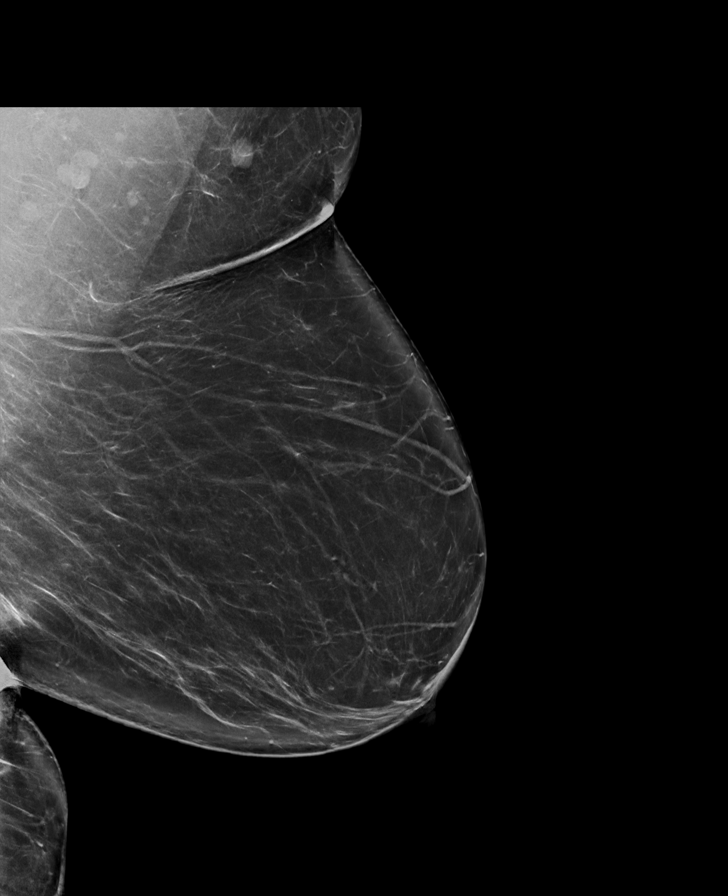

[R MLO synth-2D]
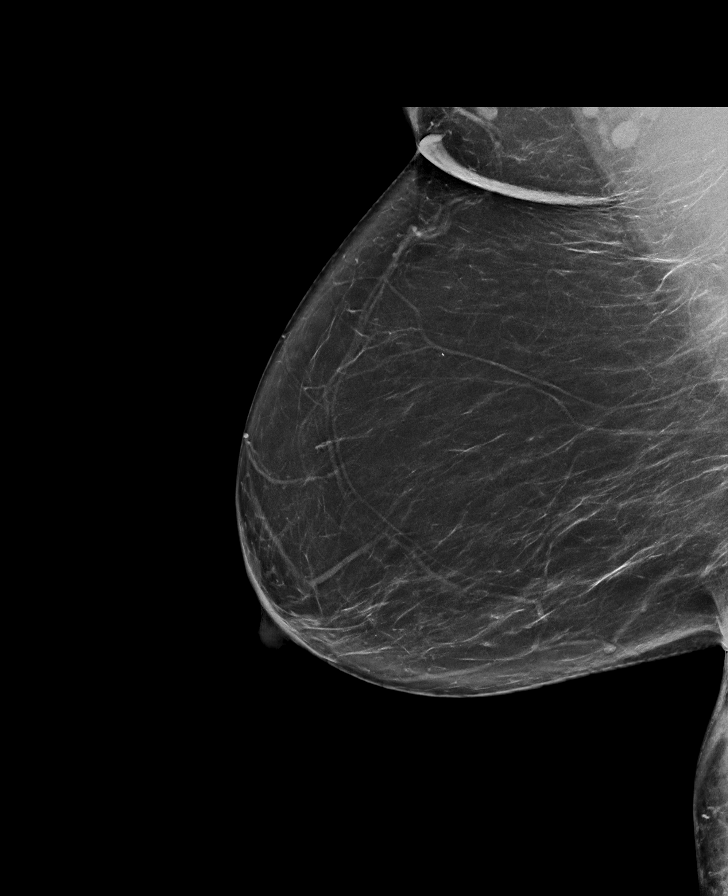

[R CC synth-2D]
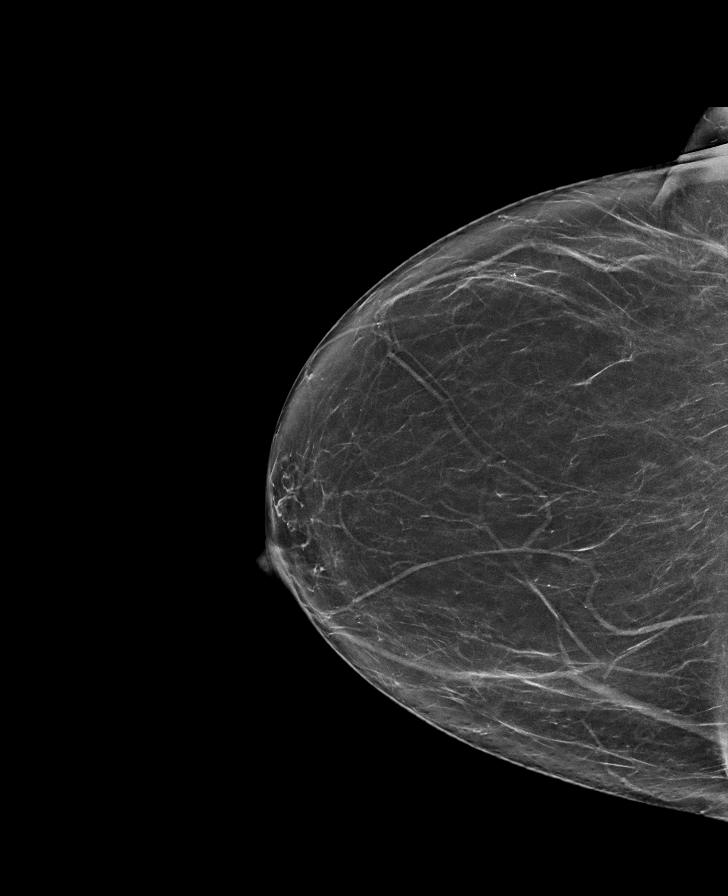

[L CC synth-2D]
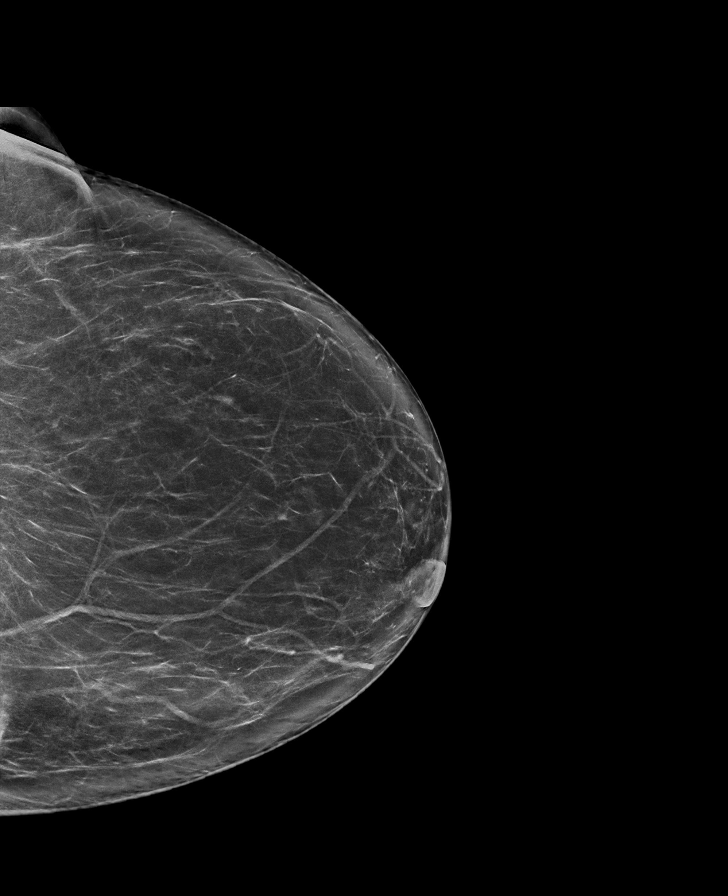

[L CC tomo · tomo slice 39/77.0]
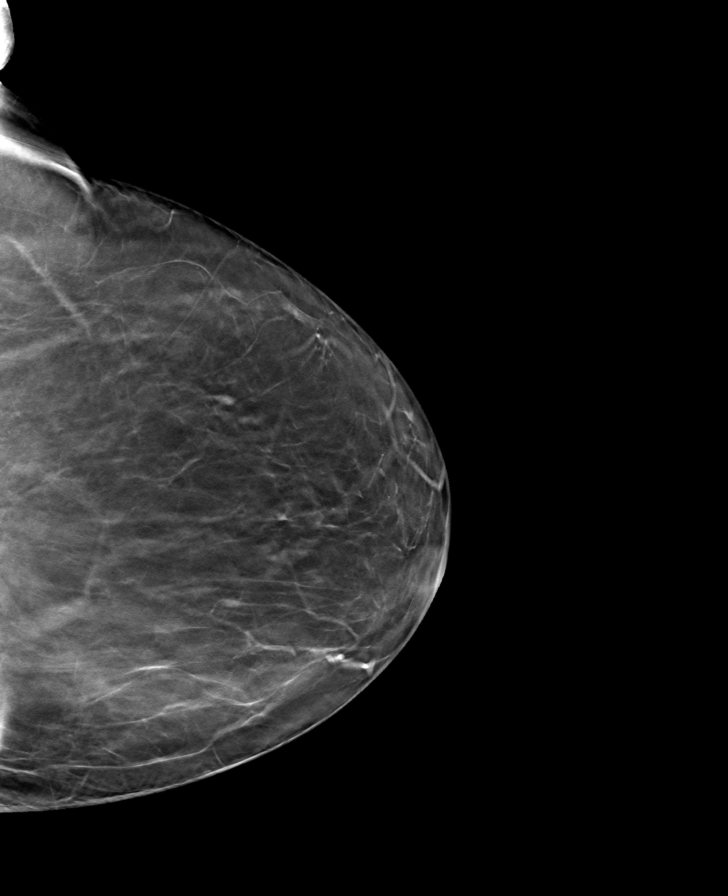

[R CC tomo · tomo slice 37/73.0]
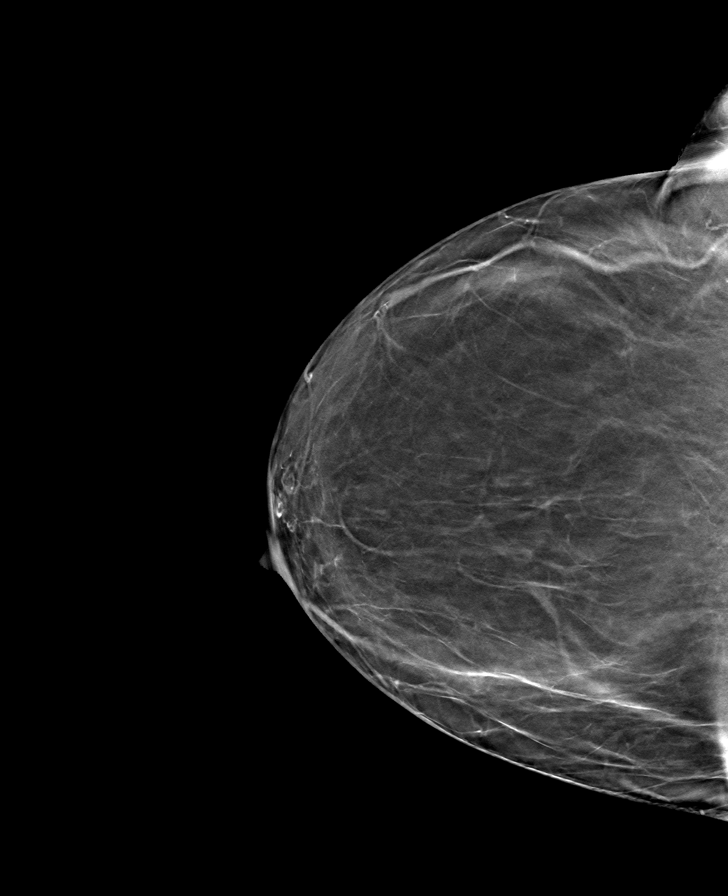

[R MLO tomo · tomo slice 41/82.0]
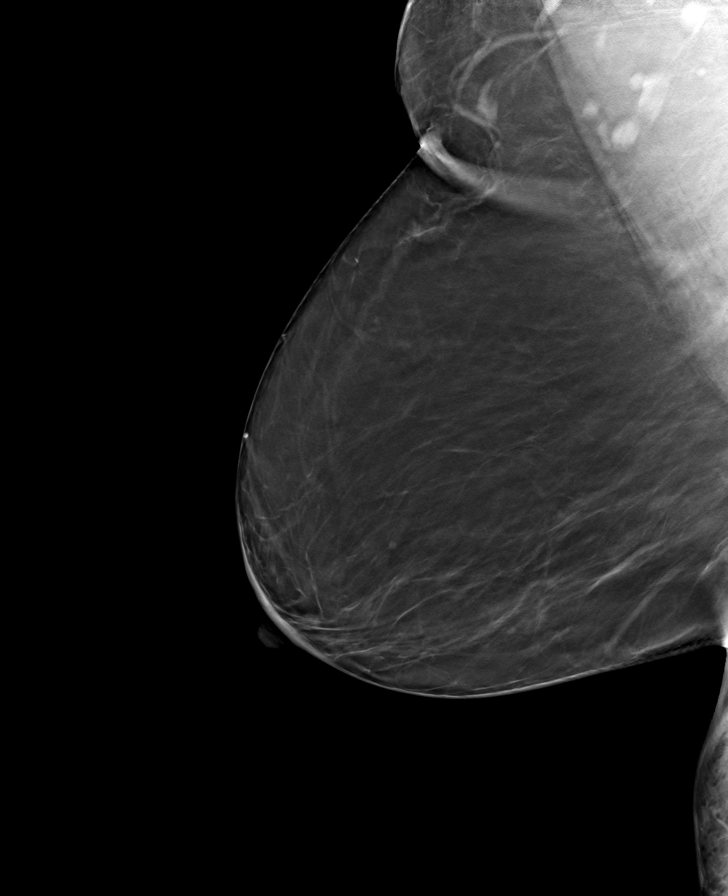

[L MLO tomo · tomo slice 45/89.0]
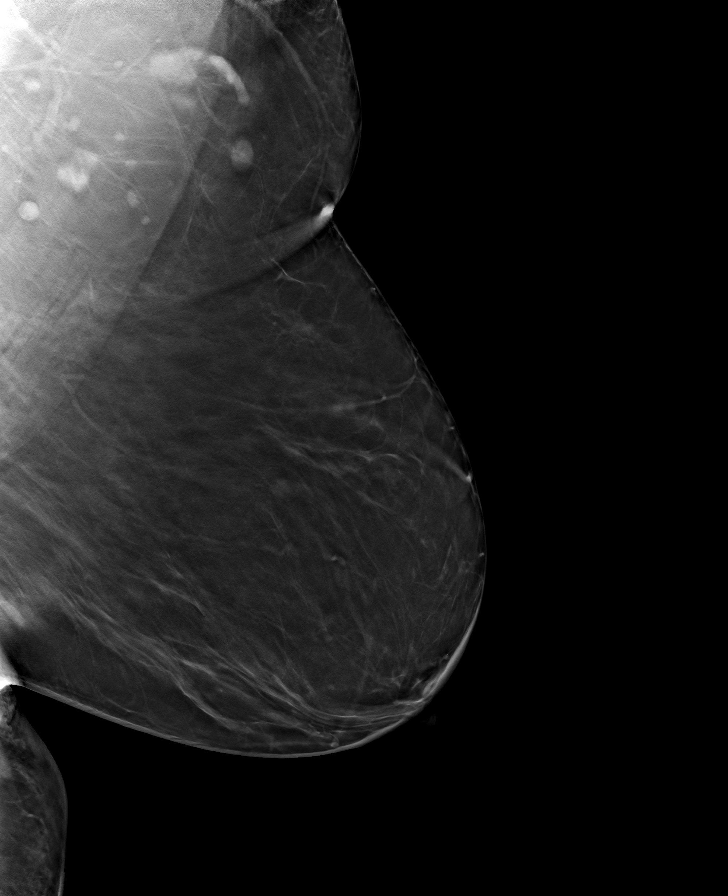

[8 of 24 positions shown; findings below may reference images not displayed]

ACR Breast Density Category b: There are scattered areas of
fibroglandular density.
FINDINGS: There are no findings suspicious for malignancy.
IMPRESSION: No mammographic evidence of malignancy. A result letter of this
screening mammogram will be mailed directly to the patient.

RECOMMENDATION:
Screening mammogram in one year. (Code:51-O-LD2)

BI-RADS CATEGORY  1: Negative.

## 2023-02-27 DIAGNOSIS — Z01419 Encounter for gynecological examination (general) (routine) without abnormal findings: Secondary | ICD-10-CM | POA: Diagnosis not present

## 2023-03-20 IMAGING — US US BIOPSY CORE LIVER
1 series · 15 of 25 positions shown · non-contrast
Comparison: none

INDICATION: Elevated liver function tests and need for random liver biopsy.

[Series 1: us biopsy mc & wl · 15 of 25 slices shown]
[im 1/25]
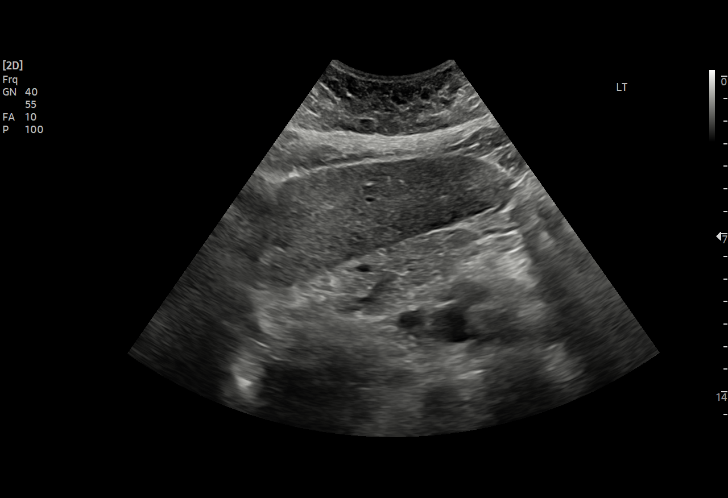
[im 3/25]
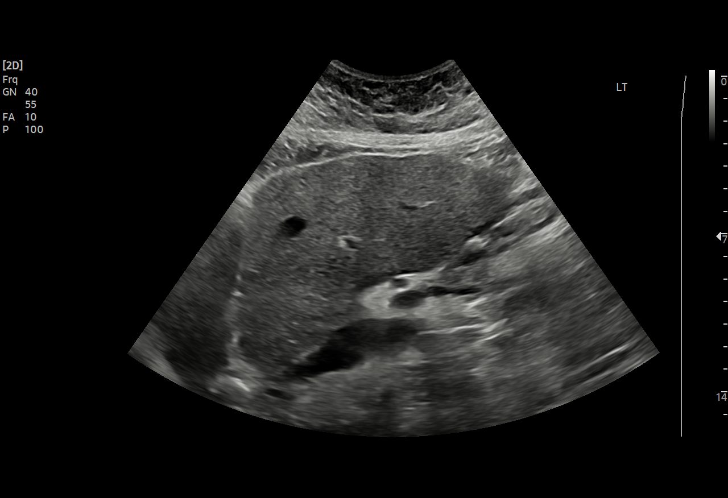
[im 5/25]
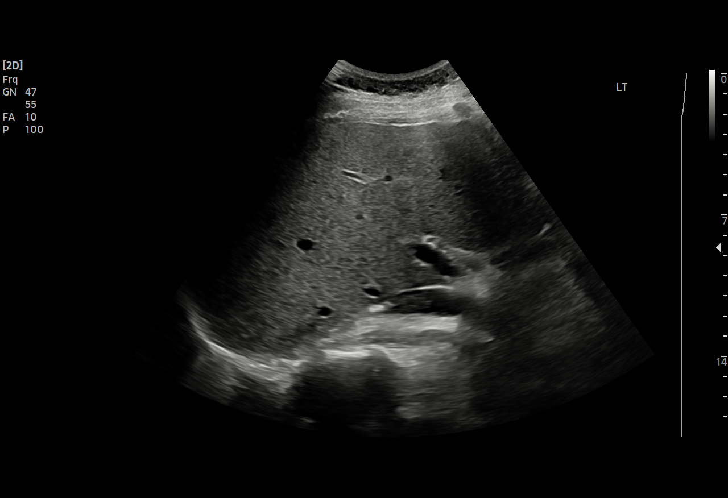
[im 6/25]
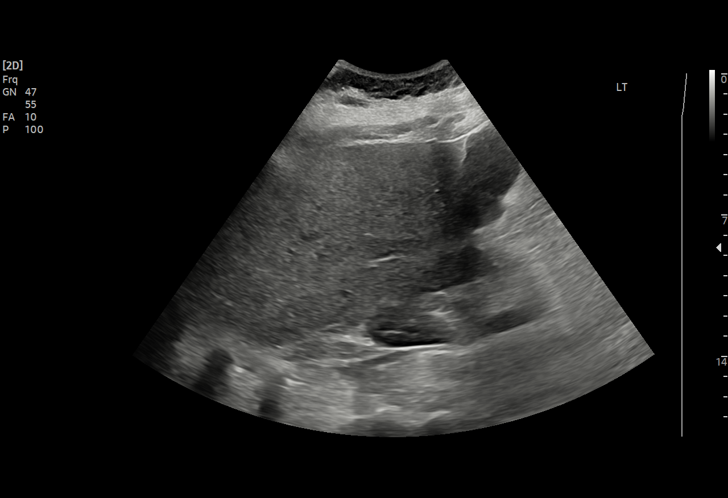
[im 8/25]
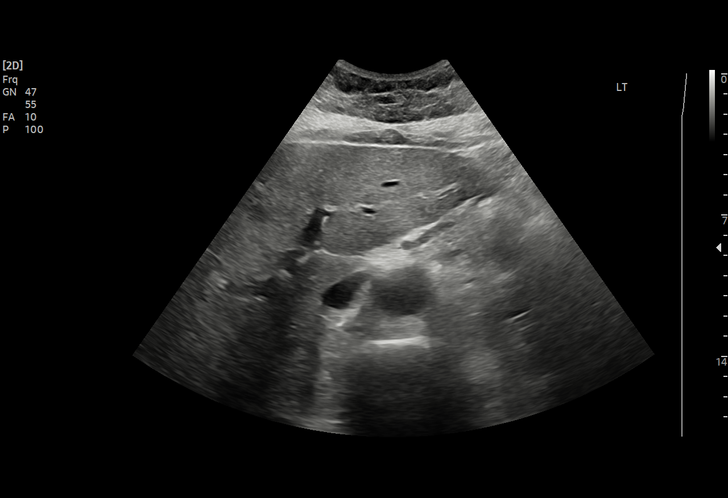
[im 10/25]
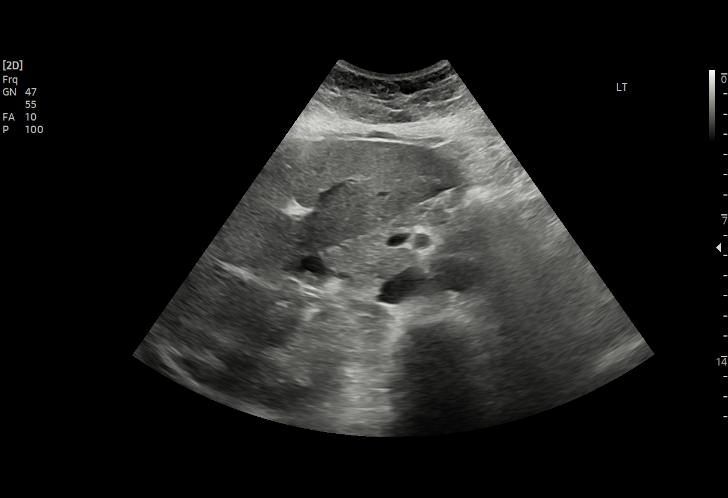
[im 11/25]
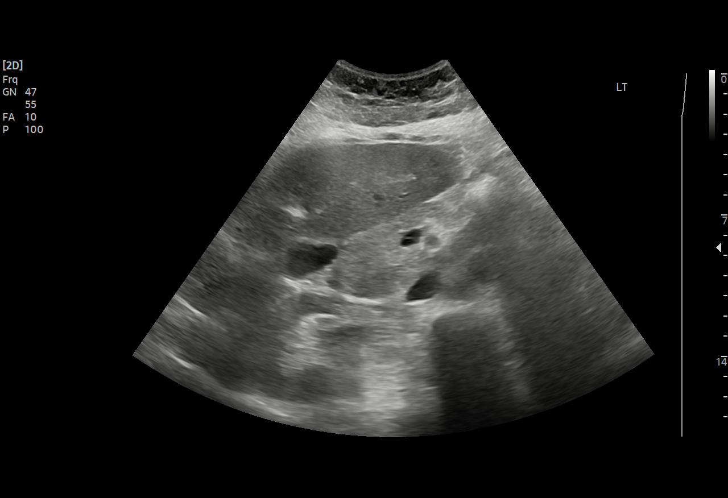
[im 13/25]
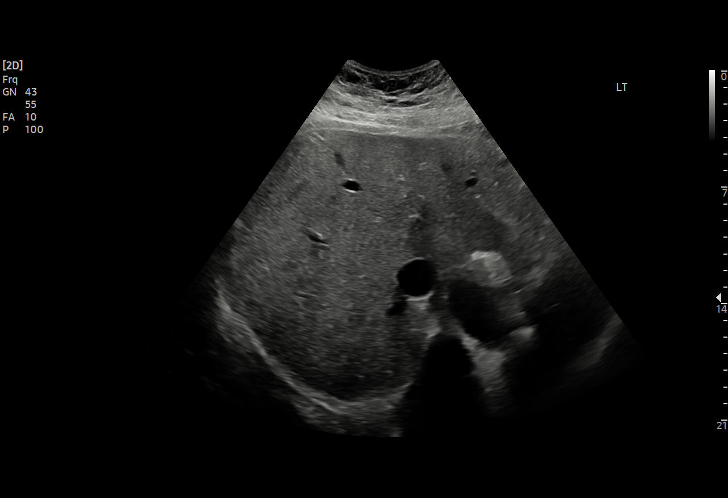
[im 15/25]
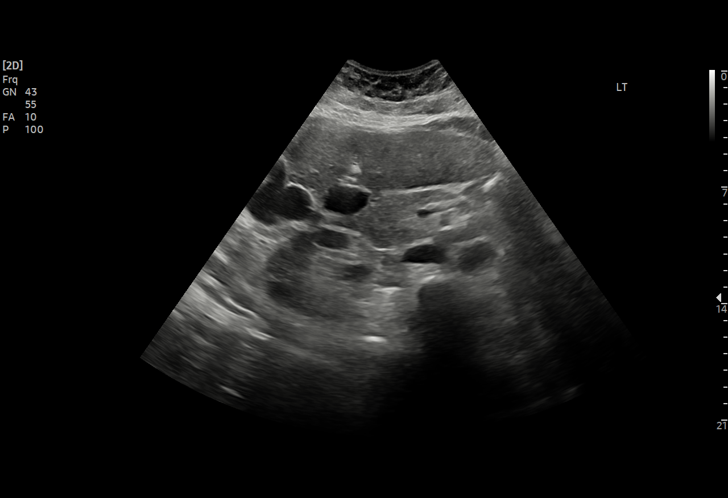
[im 16/25]
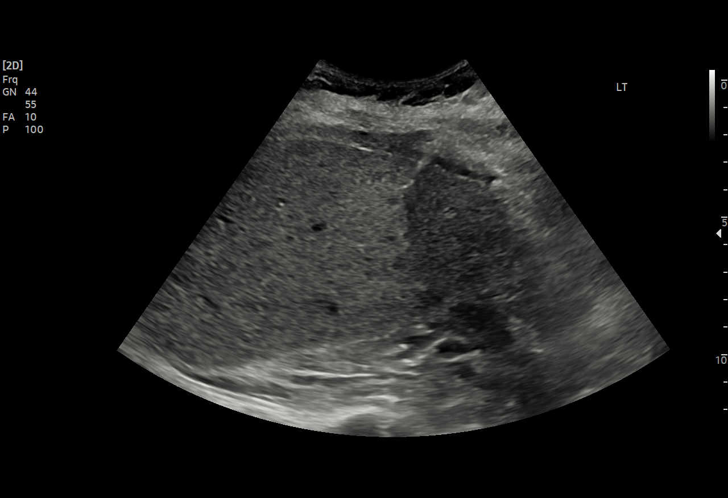
[im 18/25]
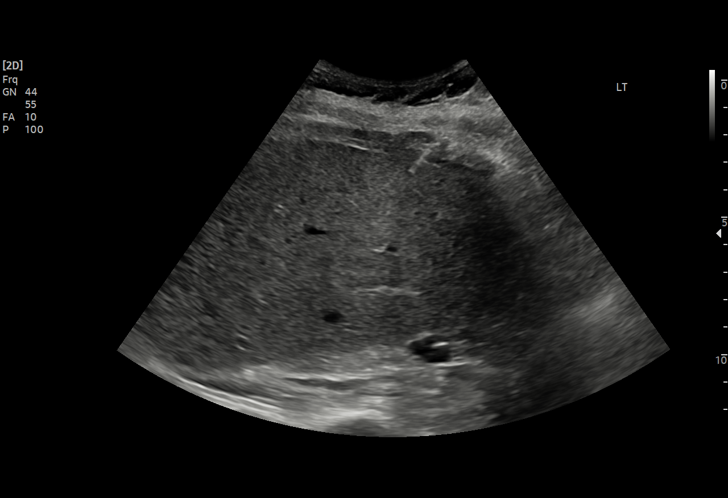
[im 20/25]
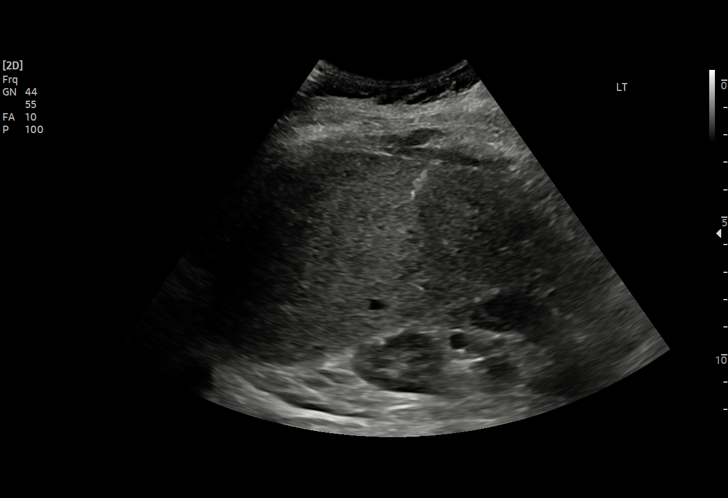
[im 21/25]
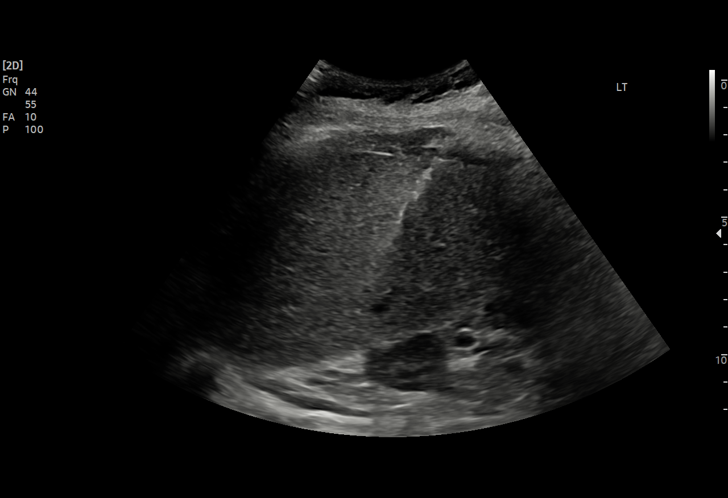
[im 23/25]
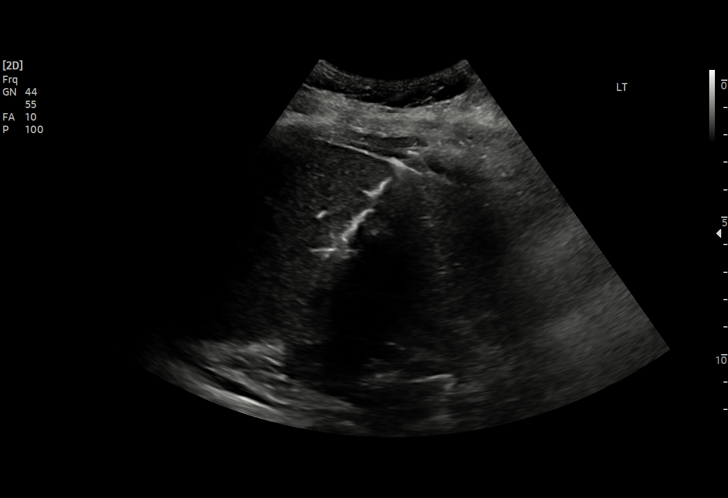
[im 25/25]
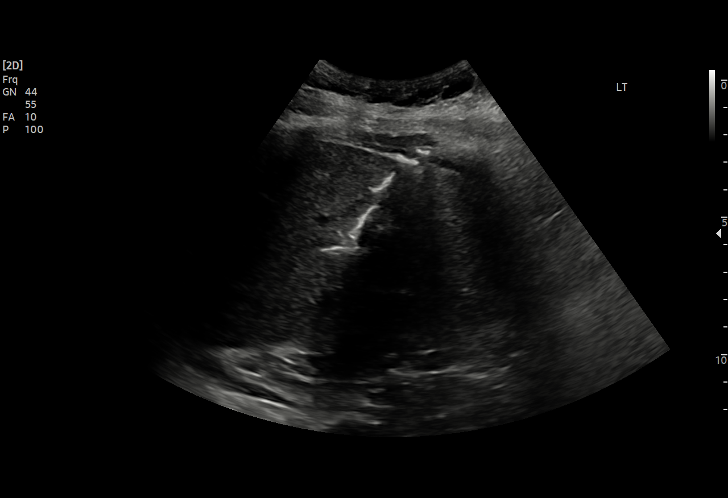

[15 of 25 positions shown; findings below may reference images not displayed]

EXAM:
ULTRASOUND GUIDED CORE BIOPSY OF LIVER

MEDICATIONS:
None.

ANESTHESIA/SEDATION:
Moderate (conscious) sedation was employed during this procedure. A
total of Versed 1.0 mg and Fentanyl 50 mcg was administered
intravenously by radiology nursing.

Moderate Sedation Time: 12 minutes. The patient's level of
consciousness and vital signs were monitored continuously by
radiology nursing throughout the procedure under my direct
supervision.

PROCEDURE:
The procedure, risks, benefits, and alternatives were explained to
the patient. Questions regarding the procedure were encouraged and
answered. The patient understands and consents to the procedure. A
time-out was performed prior to initiating the procedure.

The right abdominal wall was prepped with chlorhexidine in a sterile
fashion, and a sterile drape was applied covering the operative
field. A sterile gown and sterile gloves were used for the
procedure. Local anesthesia was provided with 1% Lidocaine.

Under ultrasound guidance, a 17 gauge trocar needle was advanced
into the right lobe of the liver. After confirming needle tip
position, coaxial 18 gauge core biopsy samples were obtained. Three
core biopsy samples were submitted in formalin. Gel-Foam pledgets
were advanced through the outer needle prior to its removal.
Additional ultrasound was performed.

COMPLICATIONS:
None immediate.
FINDINGS: Intact core biopsy samples of the liver were obtained.
IMPRESSION: Ultrasound-guided core biopsy performed of the liver were within
right lobe parenchyma.

## 2023-05-29 DIAGNOSIS — K5730 Diverticulosis of large intestine without perforation or abscess without bleeding: Secondary | ICD-10-CM | POA: Diagnosis not present

## 2023-05-29 DIAGNOSIS — R1012 Left upper quadrant pain: Secondary | ICD-10-CM | POA: Diagnosis not present

## 2023-07-11 ENCOUNTER — Ambulatory Visit: Payer: Medicare PPO | Admitting: Orthopaedic Surgery

## 2023-07-11 DIAGNOSIS — K5730 Diverticulosis of large intestine without perforation or abscess without bleeding: Secondary | ICD-10-CM | POA: Diagnosis not present

## 2023-07-11 DIAGNOSIS — I10 Essential (primary) hypertension: Secondary | ICD-10-CM | POA: Diagnosis not present

## 2023-07-11 DIAGNOSIS — D509 Iron deficiency anemia, unspecified: Secondary | ICD-10-CM | POA: Diagnosis not present

## 2023-07-11 DIAGNOSIS — I700 Atherosclerosis of aorta: Secondary | ICD-10-CM | POA: Diagnosis not present

## 2023-07-11 DIAGNOSIS — Z23 Encounter for immunization: Secondary | ICD-10-CM | POA: Diagnosis not present

## 2023-07-11 DIAGNOSIS — E7800 Pure hypercholesterolemia, unspecified: Secondary | ICD-10-CM | POA: Diagnosis not present

## 2023-07-11 DIAGNOSIS — Z0000 Encounter for general adult medical examination without abnormal findings: Secondary | ICD-10-CM | POA: Diagnosis not present

## 2023-07-11 DIAGNOSIS — K760 Fatty (change of) liver, not elsewhere classified: Secondary | ICD-10-CM | POA: Diagnosis not present

## 2023-07-26 ENCOUNTER — Encounter (HOSPITAL_COMMUNITY): Payer: Self-pay

## 2023-07-26 ENCOUNTER — Ambulatory Visit (HOSPITAL_COMMUNITY)
Admission: EM | Admit: 2023-07-26 | Discharge: 2023-07-26 | Disposition: A | Discharge status: Home / Self Care | Payer: Medicare PPO | Attending: Emergency Medicine | Admitting: Emergency Medicine

## 2023-07-26 DIAGNOSIS — M545 Low back pain, unspecified: Secondary | ICD-10-CM | POA: Diagnosis not present

## 2023-07-26 MED ORDER — LIDOCAINE 4 % EX PTCH: 1.0000 | MEDICATED_PATCH | CUTANEOUS | 0 refills | Status: AC

## 2023-07-26 MED ORDER — DEXAMETHASONE SODIUM PHOSPHATE 10 MG/ML IJ SOLN
INTRAMUSCULAR | Status: AC
  Filled 2023-07-26: qty 1

## 2023-07-26 MED ORDER — ACETAMINOPHEN 325 MG PO TABS
ORAL_TABLET | ORAL | Status: AC
  Filled 2023-07-26: qty 2

## 2023-07-26 MED ORDER — DEXAMETHASONE SODIUM PHOSPHATE 10 MG/ML IJ SOLN
10.0000 mg | Freq: Once | INTRAMUSCULAR | Status: AC
  Administered 2023-07-26: 10 mg via INTRAMUSCULAR

## 2023-07-26 MED ORDER — ACETAMINOPHEN 325 MG PO TABS
650.0000 mg | ORAL_TABLET | Freq: Once | ORAL | Status: AC
  Administered 2023-07-26: 650 mg via ORAL
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
## 2023-07-28 ENCOUNTER — Other Ambulatory Visit (INDEPENDENT_AMBULATORY_CARE_PROVIDER_SITE_OTHER): Payer: Medicare PPO

## 2023-07-28 ENCOUNTER — Ambulatory Visit: Payer: Medicare PPO | Admitting: Orthopaedic Surgery

## 2023-07-28 VITALS — BP 132/82 | HR 73 | Ht 64.0 in | Wt 213.0 lb

## 2023-07-28 DIAGNOSIS — M545 Low back pain, unspecified: Secondary | ICD-10-CM

## 2023-07-28 DIAGNOSIS — S39012A Strain of muscle, fascia and tendon of lower back, initial encounter: Secondary | ICD-10-CM
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
## 2023-08-18 ENCOUNTER — Ambulatory Visit: Payer: Medicare PPO | Admitting: Orthopaedic Surgery

## 2023-08-18 ENCOUNTER — Encounter: Payer: Self-pay | Admitting: Orthopaedic Surgery

## 2023-08-18 VITALS — BP 138/77 | HR 61 | Ht 64.0 in | Wt 213.0 lb

## 2023-08-18 DIAGNOSIS — S39012D Strain of muscle, fascia and tendon of lower back, subsequent encounter: Secondary | ICD-10-CM | POA: Diagnosis not present

## 2023-08-18 DIAGNOSIS — M533 Sacrococcygeal disorders, not elsewhere classified: Secondary | ICD-10-CM | POA: Diagnosis not present

## 2023-08-18 MED ORDER — BUPIVACAINE HCL 0.25 % IJ SOLN
1.0000 mL | INTRAMUSCULAR | Status: AC | PRN
  Administered 2023-08-18: 1 mL via INTRA_ARTICULAR

## 2023-08-18 MED ORDER — METHYLPREDNISOLONE ACETATE 40 MG/ML IJ SUSP
40.0000 mg | INTRAMUSCULAR | Status: AC | PRN
  Administered 2023-08-18: 40 mg via INTRA_ARTICULAR

## 2023-08-18 MED ORDER — LIDOCAINE HCL 1 % IJ SOLN
1.0000 mL | INTRAMUSCULAR | Status: AC | PRN
  Administered 2023-08-18: 1 mL
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
## 2023-09-01 ENCOUNTER — Telehealth: Payer: Self-pay | Admitting: Orthopaedic Surgery

## 2023-09-01 NOTE — Telephone Encounter (Signed)
Patient called and wanted to know if you wanted her to have physical therapy before or after christmas. CB#[phone]

## 2023-09-04 ENCOUNTER — Telehealth: Payer: Self-pay | Admitting: Orthopaedic Surgery

## 2023-09-04 DIAGNOSIS — M533 Sacrococcygeal disorders, not elsewhere classified: Secondary | ICD-10-CM

## 2023-09-04 DIAGNOSIS — S39012D Strain of muscle, fascia and tendon of lower back, subsequent encounter: Secondary | ICD-10-CM

## 2023-09-04 NOTE — Telephone Encounter (Signed)
Referral entered.  I called patient and advised.  Bradly Bienenstock- unfortunately we missed this referral.  Will you please call patient and get onto schedule whenever you may have an opening? I explained to her that this was our fault and that referral had not been placed previously.

## 2023-09-04 NOTE — Telephone Encounter (Signed)
Patient called. Would like to know if she will have PT or not? Her cb# [phone]
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
## 2023-09-08 ENCOUNTER — Encounter: Payer: Self-pay | Admitting: Rehabilitative and Restorative Service Providers"

## 2023-09-08 ENCOUNTER — Ambulatory Visit: Payer: Medicare PPO | Admitting: Rehabilitative and Restorative Service Providers"

## 2023-09-08 DIAGNOSIS — M5459 Other low back pain: Secondary | ICD-10-CM

## 2023-09-08 DIAGNOSIS — R262 Difficulty in walking, not elsewhere classified: Secondary | ICD-10-CM

## 2023-09-08 DIAGNOSIS — R293 Abnormal posture: Secondary | ICD-10-CM

## 2023-09-08 DIAGNOSIS — M6281 Muscle weakness (generalized): Secondary | ICD-10-CM | POA: Diagnosis not present

## 2023-09-14 ENCOUNTER — Ambulatory Visit: Payer: Medicare PPO | Admitting: Rehabilitative and Restorative Service Providers"

## 2023-09-14 ENCOUNTER — Encounter: Payer: Self-pay | Admitting: Rehabilitative and Restorative Service Providers"

## 2023-09-14 DIAGNOSIS — R293 Abnormal posture: Secondary | ICD-10-CM | POA: Diagnosis not present

## 2023-09-14 DIAGNOSIS — M6281 Muscle weakness (generalized): Secondary | ICD-10-CM | POA: Diagnosis not present

## 2023-09-14 DIAGNOSIS — R262 Difficulty in walking, not elsewhere classified: Secondary | ICD-10-CM

## 2023-09-14 DIAGNOSIS — M5459 Other low back pain: Secondary | ICD-10-CM
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
## 2023-09-15 DIAGNOSIS — J029 Acute pharyngitis, unspecified: Secondary | ICD-10-CM | POA: Diagnosis not present

## 2023-09-15 DIAGNOSIS — R0981 Nasal congestion: Secondary | ICD-10-CM | POA: Diagnosis not present

## 2023-09-15 DIAGNOSIS — R051 Acute cough: Secondary | ICD-10-CM | POA: Diagnosis not present

## 2023-09-18 DIAGNOSIS — R350 Frequency of micturition: Secondary | ICD-10-CM | POA: Diagnosis not present

## 2023-09-18 DIAGNOSIS — J989 Respiratory disorder, unspecified: Secondary | ICD-10-CM | POA: Diagnosis not present

## 2023-09-18 DIAGNOSIS — I10 Essential (primary) hypertension: Secondary | ICD-10-CM | POA: Diagnosis not present

## 2023-09-18 DIAGNOSIS — N390 Urinary tract infection, site not specified: Secondary | ICD-10-CM | POA: Diagnosis not present

## 2023-09-20 ENCOUNTER — Encounter: Payer: Self-pay | Admitting: Rehabilitative and Restorative Service Providers"

## 2023-09-20 ENCOUNTER — Ambulatory Visit: Payer: Medicare PPO | Admitting: Rehabilitative and Restorative Service Providers"

## 2023-09-20 DIAGNOSIS — M5459 Other low back pain: Secondary | ICD-10-CM

## 2023-09-20 DIAGNOSIS — M6281 Muscle weakness (generalized): Secondary | ICD-10-CM

## 2023-09-20 DIAGNOSIS — R262 Difficulty in walking, not elsewhere classified: Secondary | ICD-10-CM | POA: Diagnosis not present

## 2023-09-20 DIAGNOSIS — R293 Abnormal posture: Secondary | ICD-10-CM | POA: Diagnosis not present
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
## 2023-09-27 ENCOUNTER — Ambulatory Visit: Payer: Medicare PPO | Admitting: Rehabilitative and Restorative Service Providers"

## 2023-09-27 DIAGNOSIS — R293 Abnormal posture: Secondary | ICD-10-CM | POA: Diagnosis not present

## 2023-09-27 DIAGNOSIS — M5459 Other low back pain: Secondary | ICD-10-CM

## 2023-09-27 DIAGNOSIS — R262 Difficulty in walking, not elsewhere classified: Secondary | ICD-10-CM | POA: Diagnosis not present

## 2023-09-27 DIAGNOSIS — M6281 Muscle weakness (generalized): Secondary | ICD-10-CM | POA: Diagnosis not present

## 2023-09-27 NOTE — Patient Instructions (Signed)
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
## 2023-09-29 ENCOUNTER — Encounter: Payer: Self-pay | Admitting: Orthopaedic Surgery

## 2023-09-29 ENCOUNTER — Ambulatory Visit: Payer: Medicare PPO | Admitting: Orthopaedic Surgery

## 2023-09-29 VITALS — BP 116/78 | HR 67 | Ht 64.0 in | Wt 213.0 lb

## 2023-09-29 DIAGNOSIS — M533 Sacrococcygeal disorders, not elsewhere classified: Secondary | ICD-10-CM | POA: Diagnosis not present

## 2023-09-29 DIAGNOSIS — S39012D Strain of muscle, fascia and tendon of lower back, subsequent encounter: Secondary | ICD-10-CM | POA: Diagnosis not present

## 2023-09-29 MED ORDER — LIDOCAINE HCL 1 % IJ SOLN
1.0000 mL | INTRAMUSCULAR | Status: AC | PRN
  Administered 2023-09-29: 1 mL

## 2023-09-29 MED ORDER — BUPIVACAINE HCL 0.25 % IJ SOLN
1.0000 mL | INTRAMUSCULAR | Status: AC | PRN
  Administered 2023-09-29: 1 mL via INTRA_ARTICULAR

## 2023-09-29 MED ORDER — METHYLPREDNISOLONE ACETATE 40 MG/ML IJ SUSP
40.0000 mg | INTRAMUSCULAR | Status: AC | PRN
  Administered 2023-09-29: 40 mg via INTRA_ARTICULAR
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
## 2023-10-04 ENCOUNTER — Encounter: Payer: Self-pay | Admitting: Rehabilitative and Restorative Service Providers"

## 2023-10-04 ENCOUNTER — Ambulatory Visit: Payer: Medicare PPO | Admitting: Rehabilitative and Restorative Service Providers"

## 2023-10-04 DIAGNOSIS — M5459 Other low back pain: Secondary | ICD-10-CM | POA: Diagnosis not present

## 2023-10-04 DIAGNOSIS — R293 Abnormal posture: Secondary | ICD-10-CM | POA: Diagnosis not present

## 2023-10-04 DIAGNOSIS — M6281 Muscle weakness (generalized): Secondary | ICD-10-CM

## 2023-10-04 DIAGNOSIS — R262 Difficulty in walking, not elsewhere classified: Secondary | ICD-10-CM
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
## 2023-10-11 ENCOUNTER — Encounter: Payer: Self-pay | Admitting: Rehabilitative and Restorative Service Providers"

## 2023-10-11 ENCOUNTER — Ambulatory Visit: Payer: Medicare PPO | Admitting: Rehabilitative and Restorative Service Providers"

## 2023-10-11 DIAGNOSIS — R262 Difficulty in walking, not elsewhere classified: Secondary | ICD-10-CM | POA: Diagnosis not present

## 2023-10-11 DIAGNOSIS — M6281 Muscle weakness (generalized): Secondary | ICD-10-CM

## 2023-10-11 DIAGNOSIS — R293 Abnormal posture: Secondary | ICD-10-CM | POA: Diagnosis not present

## 2023-10-11 DIAGNOSIS — M5459 Other low back pain: Secondary | ICD-10-CM
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
## 2023-10-18 ENCOUNTER — Ambulatory Visit: Payer: Medicare PPO | Admitting: Rehabilitative and Restorative Service Providers"

## 2023-10-18 ENCOUNTER — Encounter: Payer: Self-pay | Admitting: Rehabilitative and Restorative Service Providers"

## 2023-10-18 DIAGNOSIS — R293 Abnormal posture: Secondary | ICD-10-CM | POA: Diagnosis not present

## 2023-10-18 DIAGNOSIS — M5459 Other low back pain: Secondary | ICD-10-CM | POA: Diagnosis not present

## 2023-10-18 DIAGNOSIS — R262 Difficulty in walking, not elsewhere classified: Secondary | ICD-10-CM

## 2023-10-18 DIAGNOSIS — M6281 Muscle weakness (generalized): Secondary | ICD-10-CM
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
## 2023-10-26 ENCOUNTER — Encounter: Payer: Medicare PPO | Admitting: Rehabilitative and Restorative Service Providers"

## 2023-10-26 ENCOUNTER — Encounter: Payer: Self-pay | Admitting: Physical Therapy

## 2023-10-26 ENCOUNTER — Ambulatory Visit: Payer: Medicare PPO | Admitting: Physical Therapy

## 2023-10-26 DIAGNOSIS — R293 Abnormal posture: Secondary | ICD-10-CM | POA: Diagnosis not present

## 2023-10-26 DIAGNOSIS — R262 Difficulty in walking, not elsewhere classified: Secondary | ICD-10-CM | POA: Diagnosis not present

## 2023-10-26 DIAGNOSIS — M6281 Muscle weakness (generalized): Secondary | ICD-10-CM

## 2023-10-26 DIAGNOSIS — M5459 Other low back pain: Secondary | ICD-10-CM | POA: Diagnosis not present
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
## 2023-11-08 ENCOUNTER — Encounter: Payer: Self-pay | Admitting: Rehabilitative and Restorative Service Providers"

## 2023-11-08 ENCOUNTER — Ambulatory Visit: Payer: Medicare PPO | Admitting: Rehabilitative and Restorative Service Providers"

## 2023-11-08 DIAGNOSIS — M6281 Muscle weakness (generalized): Secondary | ICD-10-CM | POA: Diagnosis not present

## 2023-11-08 DIAGNOSIS — R262 Difficulty in walking, not elsewhere classified: Secondary | ICD-10-CM

## 2023-11-08 DIAGNOSIS — R293 Abnormal posture: Secondary | ICD-10-CM

## 2023-11-08 DIAGNOSIS — M5459 Other low back pain: Secondary | ICD-10-CM
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
## 2023-11-15 ENCOUNTER — Encounter: Payer: Self-pay | Admitting: Rehabilitative and Restorative Service Providers"

## 2023-11-15 ENCOUNTER — Ambulatory Visit: Payer: Medicare PPO | Admitting: Rehabilitative and Restorative Service Providers"

## 2023-11-15 DIAGNOSIS — M6281 Muscle weakness (generalized): Secondary | ICD-10-CM | POA: Diagnosis not present

## 2023-11-15 DIAGNOSIS — R293 Abnormal posture: Secondary | ICD-10-CM | POA: Diagnosis not present

## 2023-11-15 DIAGNOSIS — M5459 Other low back pain: Secondary | ICD-10-CM

## 2023-11-15 DIAGNOSIS — R262 Difficulty in walking, not elsewhere classified: Secondary | ICD-10-CM | POA: Diagnosis not present
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
## 2023-11-20 DIAGNOSIS — H43811 Vitreous degeneration, right eye: Secondary | ICD-10-CM | POA: Diagnosis not present

## 2023-11-20 DIAGNOSIS — Z961 Presence of intraocular lens: Secondary | ICD-10-CM | POA: Diagnosis not present

## 2023-11-20 DIAGNOSIS — H04123 Dry eye syndrome of bilateral lacrimal glands: Secondary | ICD-10-CM | POA: Diagnosis not present

## 2023-11-22 ENCOUNTER — Encounter: Payer: Self-pay | Admitting: Rehabilitative and Restorative Service Providers"

## 2023-11-22 ENCOUNTER — Ambulatory Visit: Payer: Medicare PPO | Admitting: Rehabilitative and Restorative Service Providers"

## 2023-11-22 DIAGNOSIS — R293 Abnormal posture: Secondary | ICD-10-CM | POA: Diagnosis not present

## 2023-11-22 DIAGNOSIS — M6281 Muscle weakness (generalized): Secondary | ICD-10-CM | POA: Diagnosis not present

## 2023-11-22 DIAGNOSIS — R262 Difficulty in walking, not elsewhere classified: Secondary | ICD-10-CM | POA: Diagnosis not present

## 2023-11-22 DIAGNOSIS — M5459 Other low back pain: Secondary | ICD-10-CM
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
## 2023-11-24 ENCOUNTER — Ambulatory Visit: Payer: Medicare PPO | Admitting: Orthopaedic Surgery

## 2023-11-24 ENCOUNTER — Encounter: Payer: Self-pay | Admitting: Orthopaedic Surgery

## 2023-11-24 VITALS — Ht 64.0 in | Wt 213.0 lb

## 2023-11-24 DIAGNOSIS — S39012D Strain of muscle, fascia and tendon of lower back, subsequent encounter: Secondary | ICD-10-CM | POA: Diagnosis not present

## 2023-11-24 DIAGNOSIS — S39012S Strain of muscle, fascia and tendon of lower back, sequela: Secondary | ICD-10-CM
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
## 2023-11-29 ENCOUNTER — Ambulatory Visit: Payer: Medicare PPO | Admitting: Rehabilitative and Restorative Service Providers"

## 2023-11-29 ENCOUNTER — Encounter: Payer: Self-pay | Admitting: Rehabilitative and Restorative Service Providers"

## 2023-11-29 DIAGNOSIS — R262 Difficulty in walking, not elsewhere classified: Secondary | ICD-10-CM | POA: Diagnosis not present

## 2023-11-29 DIAGNOSIS — J988 Other specified respiratory disorders: Secondary | ICD-10-CM | POA: Diagnosis not present

## 2023-11-29 DIAGNOSIS — R293 Abnormal posture: Secondary | ICD-10-CM

## 2023-11-29 DIAGNOSIS — M6281 Muscle weakness (generalized): Secondary | ICD-10-CM | POA: Diagnosis not present

## 2023-11-29 DIAGNOSIS — R058 Other specified cough: Secondary | ICD-10-CM | POA: Diagnosis not present

## 2023-11-29 DIAGNOSIS — M5459 Other low back pain: Secondary | ICD-10-CM

## 2023-11-29 DIAGNOSIS — Z6836 Body mass index (BMI) 36.0-36.9, adult: Secondary | ICD-10-CM | POA: Diagnosis not present

## 2023-11-29 DIAGNOSIS — I700 Atherosclerosis of aorta: Secondary | ICD-10-CM | POA: Diagnosis not present
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
## 2023-12-06 ENCOUNTER — Ambulatory Visit: Payer: Medicare PPO | Admitting: Rehabilitative and Restorative Service Providers"

## 2023-12-06 ENCOUNTER — Encounter: Payer: Self-pay | Admitting: Rehabilitative and Restorative Service Providers"

## 2023-12-06 DIAGNOSIS — M6281 Muscle weakness (generalized): Secondary | ICD-10-CM

## 2023-12-06 DIAGNOSIS — R262 Difficulty in walking, not elsewhere classified: Secondary | ICD-10-CM | POA: Diagnosis not present

## 2023-12-06 DIAGNOSIS — M5459 Other low back pain: Secondary | ICD-10-CM

## 2023-12-06 DIAGNOSIS — R293 Abnormal posture: Secondary | ICD-10-CM
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
## 2023-12-13 ENCOUNTER — Encounter: Admitting: Rehabilitative and Restorative Service Providers"

## 2023-12-20 ENCOUNTER — Encounter: Admitting: Rehabilitative and Restorative Service Providers"

## 2023-12-21 ENCOUNTER — Encounter: Payer: Self-pay | Admitting: Rehabilitative and Restorative Service Providers"

## 2023-12-21 ENCOUNTER — Ambulatory Visit: Admitting: Rehabilitative and Restorative Service Providers"

## 2023-12-21 DIAGNOSIS — R293 Abnormal posture: Secondary | ICD-10-CM

## 2023-12-21 DIAGNOSIS — R262 Difficulty in walking, not elsewhere classified: Secondary | ICD-10-CM | POA: Diagnosis not present

## 2023-12-21 DIAGNOSIS — M6281 Muscle weakness (generalized): Secondary | ICD-10-CM | POA: Diagnosis not present

## 2023-12-21 DIAGNOSIS — M5459 Other low back pain: Secondary | ICD-10-CM
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
## 2023-12-26 ENCOUNTER — Other Ambulatory Visit: Payer: Self-pay | Admitting: Internal Medicine

## 2023-12-26 DIAGNOSIS — Z1231 Encounter for screening mammogram for malignant neoplasm of breast: Secondary | ICD-10-CM

## 2023-12-27 ENCOUNTER — Ambulatory Visit: Admitting: Rehabilitative and Restorative Service Providers"

## 2023-12-27 ENCOUNTER — Encounter: Payer: Self-pay | Admitting: Rehabilitative and Restorative Service Providers"

## 2023-12-27 DIAGNOSIS — M6281 Muscle weakness (generalized): Secondary | ICD-10-CM | POA: Diagnosis not present

## 2023-12-27 DIAGNOSIS — R293 Abnormal posture: Secondary | ICD-10-CM | POA: Diagnosis not present

## 2023-12-27 DIAGNOSIS — R262 Difficulty in walking, not elsewhere classified: Secondary | ICD-10-CM | POA: Diagnosis not present

## 2023-12-27 DIAGNOSIS — M5459 Other low back pain: Secondary | ICD-10-CM
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
## 2024-01-02 DIAGNOSIS — T7840XA Allergy, unspecified, initial encounter: Secondary | ICD-10-CM | POA: Diagnosis not present

## 2024-01-02 DIAGNOSIS — K219 Gastro-esophageal reflux disease without esophagitis: Secondary | ICD-10-CM | POA: Diagnosis not present

## 2024-01-02 DIAGNOSIS — R350 Frequency of micturition: Secondary | ICD-10-CM | POA: Diagnosis not present

## 2024-01-02 DIAGNOSIS — R0982 Postnasal drip: Secondary | ICD-10-CM | POA: Diagnosis not present

## 2024-01-02 DIAGNOSIS — R3915 Urgency of urination: Secondary | ICD-10-CM | POA: Diagnosis not present

## 2024-01-02 DIAGNOSIS — R059 Cough, unspecified: Secondary | ICD-10-CM | POA: Diagnosis not present

## 2024-01-03 ENCOUNTER — Ambulatory Visit: Admitting: Rehabilitative and Restorative Service Providers"

## 2024-01-03 ENCOUNTER — Encounter: Payer: Self-pay | Admitting: Rehabilitative and Restorative Service Providers"

## 2024-01-03 DIAGNOSIS — M6281 Muscle weakness (generalized): Secondary | ICD-10-CM

## 2024-01-03 DIAGNOSIS — R262 Difficulty in walking, not elsewhere classified: Secondary | ICD-10-CM | POA: Diagnosis not present

## 2024-01-03 DIAGNOSIS — M5459 Other low back pain: Secondary | ICD-10-CM | POA: Diagnosis not present

## 2024-01-03 DIAGNOSIS — R293 Abnormal posture: Secondary | ICD-10-CM | POA: Diagnosis not present
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
## 2024-01-08 DIAGNOSIS — A609 Anogenital herpesviral infection, unspecified: Secondary | ICD-10-CM | POA: Diagnosis not present

## 2024-01-08 DIAGNOSIS — I10 Essential (primary) hypertension: Secondary | ICD-10-CM | POA: Diagnosis not present

## 2024-01-08 DIAGNOSIS — N3000 Acute cystitis without hematuria: Secondary | ICD-10-CM | POA: Diagnosis not present

## 2024-01-08 DIAGNOSIS — K219 Gastro-esophageal reflux disease without esophagitis: Secondary | ICD-10-CM | POA: Diagnosis not present

## 2024-01-08 DIAGNOSIS — R195 Other fecal abnormalities: Secondary | ICD-10-CM | POA: Diagnosis not present

## 2024-01-08 DIAGNOSIS — M5442 Lumbago with sciatica, left side: Secondary | ICD-10-CM | POA: Diagnosis not present

## 2024-01-08 DIAGNOSIS — T7840XA Allergy, unspecified, initial encounter: Secondary | ICD-10-CM | POA: Diagnosis not present

## 2024-01-08 DIAGNOSIS — D509 Iron deficiency anemia, unspecified: Secondary | ICD-10-CM | POA: Diagnosis not present

## 2024-01-10 ENCOUNTER — Encounter: Payer: Self-pay | Admitting: Rehabilitative and Restorative Service Providers"

## 2024-01-10 ENCOUNTER — Ambulatory Visit: Admitting: Rehabilitative and Restorative Service Providers"

## 2024-01-10 DIAGNOSIS — M5459 Other low back pain: Secondary | ICD-10-CM | POA: Diagnosis not present

## 2024-01-10 DIAGNOSIS — R262 Difficulty in walking, not elsewhere classified: Secondary | ICD-10-CM | POA: Diagnosis not present

## 2024-01-10 DIAGNOSIS — R293 Abnormal posture: Secondary | ICD-10-CM | POA: Diagnosis not present

## 2024-01-10 DIAGNOSIS — M6281 Muscle weakness (generalized): Secondary | ICD-10-CM
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
## 2024-01-29 ENCOUNTER — Ambulatory Visit
Admission: RE | Admit: 2024-01-29 | Discharge: 2024-01-29 | Disposition: A | Discharge status: Home / Self Care | Source: Ambulatory Visit | Attending: Internal Medicine | Admitting: Internal Medicine

## 2024-01-29 ENCOUNTER — Ambulatory Visit

## 2024-01-29 DIAGNOSIS — Z1231 Encounter for screening mammogram for malignant neoplasm of breast: Secondary | ICD-10-CM | POA: Diagnosis not present

## 2024-03-18 DIAGNOSIS — Z01419 Encounter for gynecological examination (general) (routine) without abnormal findings: Secondary | ICD-10-CM | POA: Diagnosis not present

## 2024-03-18 DIAGNOSIS — R102 Pelvic and perineal pain: Secondary | ICD-10-CM | POA: Diagnosis not present

## 2024-03-25 DIAGNOSIS — R102 Pelvic and perineal pain: Secondary | ICD-10-CM | POA: Diagnosis not present

## 2024-05-17 DIAGNOSIS — D509 Iron deficiency anemia, unspecified: Secondary | ICD-10-CM | POA: Diagnosis not present

## 2024-05-17 DIAGNOSIS — K760 Fatty (change of) liver, not elsewhere classified: Secondary | ICD-10-CM | POA: Diagnosis not present

## 2024-05-17 DIAGNOSIS — K219 Gastro-esophageal reflux disease without esophagitis: Secondary | ICD-10-CM | POA: Diagnosis not present

## 2024-06-17 DIAGNOSIS — R051 Acute cough: Secondary | ICD-10-CM | POA: Diagnosis not present

## 2024-06-17 DIAGNOSIS — Z03818 Encounter for observation for suspected exposure to other biological agents ruled out: Secondary | ICD-10-CM | POA: Diagnosis not present

## 2024-06-17 DIAGNOSIS — J988 Other specified respiratory disorders: Secondary | ICD-10-CM | POA: Diagnosis not present

## 2024-07-15 DIAGNOSIS — K219 Gastro-esophageal reflux disease without esophagitis: Secondary | ICD-10-CM | POA: Diagnosis not present

## 2024-07-15 DIAGNOSIS — J309 Allergic rhinitis, unspecified: Secondary | ICD-10-CM | POA: Diagnosis not present

## 2024-07-15 DIAGNOSIS — I10 Essential (primary) hypertension: Secondary | ICD-10-CM | POA: Diagnosis not present

## 2024-07-15 DIAGNOSIS — Z0000 Encounter for general adult medical examination without abnormal findings: Secondary | ICD-10-CM | POA: Diagnosis not present

## 2024-07-15 DIAGNOSIS — D509 Iron deficiency anemia, unspecified: Secondary | ICD-10-CM | POA: Diagnosis not present

## 2024-07-15 DIAGNOSIS — R748 Abnormal levels of other serum enzymes: Secondary | ICD-10-CM | POA: Diagnosis not present

## 2024-07-15 DIAGNOSIS — E7800 Pure hypercholesterolemia, unspecified: Secondary | ICD-10-CM | POA: Diagnosis not present

## 2024-07-15 DIAGNOSIS — M170 Bilateral primary osteoarthritis of knee: Secondary | ICD-10-CM | POA: Diagnosis not present

## 2024-08-27 ENCOUNTER — Other Ambulatory Visit: Payer: Self-pay | Admitting: Internal Medicine

## 2024-08-27 DIAGNOSIS — R1032 Left lower quadrant pain: Secondary | ICD-10-CM

## 2024-08-30 ENCOUNTER — Inpatient Hospital Stay
Admission: RE | Admit: 2024-08-30 | Discharge: 2024-08-30 | Disposition: A | Source: Ambulatory Visit | Attending: Internal Medicine

## 2024-08-30 DIAGNOSIS — R1032 Left lower quadrant pain: Secondary | ICD-10-CM

## 2024-08-30 MED ORDER — IOPAMIDOL (ISOVUE-300) INJECTION 61%
100.0000 mL | Freq: Once | INTRAVENOUS | Status: AC | PRN
  Administered 2024-08-30: 100 mL via INTRAVENOUS
